# Patient Record
Sex: Male | Born: 1965 | Race: Black or African American | Hispanic: No | Marital: Single | Smoking: Former smoker
Health system: Southern US, Community
[De-identification: ages and names within clinical notes are randomized; demographics above are authoritative.]

## PROBLEM LIST (undated history)

## (undated) DIAGNOSIS — I5022 Chronic systolic (congestive) heart failure: Secondary | ICD-10-CM

## (undated) DIAGNOSIS — I340 Nonrheumatic mitral (valve) insufficiency: Secondary | ICD-10-CM

## (undated) DIAGNOSIS — D6859 Other primary thrombophilia: Secondary | ICD-10-CM

## (undated) DIAGNOSIS — I2699 Other pulmonary embolism without acute cor pulmonale: Secondary | ICD-10-CM

## (undated) DIAGNOSIS — I82409 Acute embolism and thrombosis of unspecified deep veins of unspecified lower extremity: Secondary | ICD-10-CM

## (undated) DIAGNOSIS — E785 Hyperlipidemia, unspecified: Secondary | ICD-10-CM

## (undated) DIAGNOSIS — I428 Other cardiomyopathies: Secondary | ICD-10-CM

## (undated) DIAGNOSIS — I10 Essential (primary) hypertension: Secondary | ICD-10-CM

## (undated) DIAGNOSIS — I639 Cerebral infarction, unspecified: Secondary | ICD-10-CM

## (undated) DIAGNOSIS — I351 Nonrheumatic aortic (valve) insufficiency: Secondary | ICD-10-CM

## (undated) DIAGNOSIS — I2510 Atherosclerotic heart disease of native coronary artery without angina pectoris: Secondary | ICD-10-CM

## (undated) DIAGNOSIS — E119 Type 2 diabetes mellitus without complications: Secondary | ICD-10-CM

---

## 2015-12-26 ENCOUNTER — Emergency Department (HOSPITAL_COMMUNITY): Payer: Medicaid Other

## 2015-12-26 ENCOUNTER — Encounter (HOSPITAL_COMMUNITY): Payer: Self-pay | Admitting: Emergency Medicine

## 2015-12-26 ENCOUNTER — Inpatient Hospital Stay (HOSPITAL_COMMUNITY)
Admission: EM | Admit: 2015-12-26 | Discharge: 2015-12-30 | DRG: 064 | Disposition: A | Discharge status: Home / Self Care | Payer: Medicaid Other | Attending: Internal Medicine | Admitting: Internal Medicine

## 2015-12-26 DIAGNOSIS — Z7982 Long term (current) use of aspirin: Secondary | ICD-10-CM

## 2015-12-26 DIAGNOSIS — E1159 Type 2 diabetes mellitus with other circulatory complications: Secondary | ICD-10-CM | POA: Diagnosis present

## 2015-12-26 DIAGNOSIS — I428 Other cardiomyopathies: Secondary | ICD-10-CM

## 2015-12-26 DIAGNOSIS — I63511 Cerebral infarction due to unspecified occlusion or stenosis of right middle cerebral artery: Secondary | ICD-10-CM | POA: Diagnosis present

## 2015-12-26 DIAGNOSIS — I2510 Atherosclerotic heart disease of native coronary artery without angina pectoris: Secondary | ICD-10-CM

## 2015-12-26 DIAGNOSIS — Z9114 Patient's other noncompliance with medication regimen: Secondary | ICD-10-CM

## 2015-12-26 DIAGNOSIS — D6859 Other primary thrombophilia: Secondary | ICD-10-CM | POA: Diagnosis present

## 2015-12-26 DIAGNOSIS — Z86711 Personal history of pulmonary embolism: Secondary | ICD-10-CM | POA: Diagnosis present

## 2015-12-26 DIAGNOSIS — I10 Essential (primary) hypertension: Secondary | ICD-10-CM | POA: Diagnosis present

## 2015-12-26 DIAGNOSIS — R29810 Facial weakness: Secondary | ICD-10-CM | POA: Diagnosis present

## 2015-12-26 DIAGNOSIS — I639 Cerebral infarction, unspecified: Secondary | ICD-10-CM | POA: Diagnosis present

## 2015-12-26 DIAGNOSIS — R4701 Aphasia: Secondary | ICD-10-CM | POA: Diagnosis present

## 2015-12-26 DIAGNOSIS — I519 Heart disease, unspecified: Secondary | ICD-10-CM | POA: Diagnosis present

## 2015-12-26 DIAGNOSIS — I5023 Acute on chronic systolic (congestive) heart failure: Secondary | ICD-10-CM

## 2015-12-26 DIAGNOSIS — I110 Hypertensive heart disease with heart failure: Secondary | ICD-10-CM | POA: Diagnosis present

## 2015-12-26 DIAGNOSIS — R0902 Hypoxemia: Secondary | ICD-10-CM

## 2015-12-26 DIAGNOSIS — I63411 Cerebral infarction due to embolism of right middle cerebral artery: Principal | ICD-10-CM | POA: Diagnosis present

## 2015-12-26 DIAGNOSIS — Z7901 Long term (current) use of anticoagulants: Secondary | ICD-10-CM

## 2015-12-26 DIAGNOSIS — I429 Cardiomyopathy, unspecified: Secondary | ICD-10-CM | POA: Diagnosis present

## 2015-12-26 DIAGNOSIS — E1169 Type 2 diabetes mellitus with other specified complication: Secondary | ICD-10-CM | POA: Diagnosis present

## 2015-12-26 DIAGNOSIS — Z86718 Personal history of other venous thrombosis and embolism: Secondary | ICD-10-CM

## 2015-12-26 DIAGNOSIS — E785 Hyperlipidemia, unspecified: Secondary | ICD-10-CM

## 2015-12-26 DIAGNOSIS — Z87891 Personal history of nicotine dependence: Secondary | ICD-10-CM

## 2015-12-26 HISTORY — DX: Essential (primary) hypertension: I10

## 2015-12-26 HISTORY — DX: Type 2 diabetes mellitus without complications: E11.9

## 2015-12-26 HISTORY — DX: Other pulmonary embolism without acute cor pulmonale: I26.99

## 2015-12-26 HISTORY — DX: Acute embolism and thrombosis of unspecified deep veins of unspecified lower extremity: I82.409

## 2015-12-26 HISTORY — DX: Atherosclerotic heart disease of native coronary artery without angina pectoris: I25.10

## 2015-12-26 HISTORY — DX: Other cardiomyopathies: I42.8

## 2015-12-26 HISTORY — DX: Nonrheumatic aortic (valve) insufficiency: I35.1

## 2015-12-26 HISTORY — DX: Hyperlipidemia, unspecified: E78.5

## 2015-12-26 HISTORY — DX: Nonrheumatic mitral (valve) insufficiency: I34.0

## 2015-12-26 HISTORY — DX: Chronic systolic (congestive) heart failure: I50.22

## 2015-12-26 HISTORY — DX: Other primary thrombophilia: D68.59

## 2015-12-26 HISTORY — DX: Cerebral infarction, unspecified: I63.9

## 2015-12-26 LAB — COMPREHENSIVE METABOLIC PANEL
ALT: 45 U/L (ref 17–63)
AST: 22 U/L (ref 15–41)
Albumin: 3.7 g/dL (ref 3.5–5.0)
Alkaline Phosphatase: 109 U/L (ref 38–126)
Anion gap: 11 (ref 5–15)
BUN: 14 mg/dL (ref 6–20)
CO2: 24 mmol/L (ref 22–32)
Calcium: 9.2 mg/dL (ref 8.9–10.3)
Chloride: 108 mmol/L (ref 101–111)
Creatinine, Ser: 1.32 mg/dL — ABNORMAL HIGH (ref 0.61–1.24)
GFR calc Af Amer: >60 mL/min (ref >60)
GFR calc non Af Amer: >60 mL/min (ref >60)
Glucose, Bld: 150 mg/dL — ABNORMAL HIGH (ref 65–99)
Potassium: 4 mmol/L (ref 3.5–5.1)
Sodium: 143 mmol/L (ref 135–145)
Total Bilirubin: 1.1 mg/dL (ref 0.3–1.2)
Total Protein: 6.7 g/dL (ref 6.5–8.1)

## 2015-12-26 LAB — PROTIME-INR
INR: 1.21 (ref 0.00–1.49)
Prothrombin Time: 15.5 seconds — ABNORMAL HIGH (ref 11.6–15.2)

## 2015-12-26 LAB — CBC
HCT: 43 % (ref 39.0–52.0)
Hemoglobin: 13.9 g/dL (ref 13.0–17.0)
MCH: 31.8 pg (ref 26.0–34.0)
MCHC: 32.3 g/dL (ref 30.0–36.0)
MCV: 98.4 fL (ref 78.0–100.0)
Platelets: 321 10*3/uL (ref 150–400)
RBC: 4.37 MIL/uL (ref 4.22–5.81)
RDW: 13.5 % (ref 11.5–15.5)
WBC: 5.5 10*3/uL (ref 4.0–10.5)

## 2015-12-26 LAB — DIFFERENTIAL
Basophils Absolute: 0 10*3/uL (ref 0.0–0.1)
Basophils Relative: 0 %
Eosinophils Absolute: 0.1 10*3/uL (ref 0.0–0.7)
Eosinophils Relative: 1 %
Lymphocytes Relative: 38 %
Lymphs Abs: 2.1 10*3/uL (ref 0.7–4.0)
Monocytes Absolute: 0.2 10*3/uL (ref 0.1–1.0)
Monocytes Relative: 4 %
Neutro Abs: 3.1 10*3/uL (ref 1.7–7.7)
Neutrophils Relative %: 57 %

## 2015-12-26 LAB — I-STAT TROPONIN, ED: Troponin i, poc: 0.07 ng/mL (ref 0.00–0.08)

## 2015-12-26 LAB — APTT: aPTT: 29 seconds (ref 24–37)

## 2015-12-26 MED ORDER — ASPIRIN 81 MG PO CHEW
324.0000 mg | CHEWABLE_TABLET | Freq: Once | ORAL | Status: AC
  Administered 2015-12-27: 324 mg via ORAL
  Filled 2015-12-26: qty 4

## 2015-12-26 MED ORDER — OXYCODONE-ACETAMINOPHEN 5-325 MG PO TABS
2.0000 | ORAL_TABLET | Freq: Once | ORAL | Status: AC
  Administered 2015-12-26: 2 via ORAL
  Filled 2015-12-26: qty 2

## 2015-12-26 NOTE — ED Provider Notes (Signed)
CSN: [id number]     Arrival date & time 12/26/15  1525 History   First MD Initiated Contact with Patient 12/26/15 1844     Chief Complaint  Patient presents with  . Shortness of Breath  . Facial Droop  . Aphasia     (Consider location/radiation/quality/duration/timing/severity/associated sxs/prior Treatment) Patient is a 50 y.o. male presenting with Acute Neurological Problem. The history is provided by the patient. No language interpreter was used.  Cerebrovascular Accident This is a new problem. The current episode started today. The problem occurs constantly. The problem has been unchanged. Associated symptoms include weakness. Pertinent negatives include no fever or sore throat. Nothing aggravates the symptoms. He has tried nothing for the symptoms. The treatment provided no relief.  Pt reports he got up this am to take his dog out at around 8:30.  Pt noticed while walking the dog that he could not control drool coming out of the left side of his face.  Pt has had a PE in the past and is suppose to be taking coumadin but ran out.  (several days without)   Past Medical History  Diagnosis Date  . Pulmonary embolism (HCC)   . Hypertension   . Diabetes mellitus without complication (HCC)    History reviewed. No pertinent past surgical history. No family history on file. Social History  Substance Use Topics  . Smoking status: Current Some Day Smoker  . Smokeless tobacco: None  . Alcohol Use: No    Review of Systems  Constitutional: Negative for fever.  HENT: Negative for sore throat.   Neurological: Positive for weakness.  All other systems reviewed and are negative.     Allergies  Review of patient's allergies indicates no known allergies.  Home Medications   Prior to Admission medications   Medication Sig Start Date End Date Taking? Authorizing Provider  aspirin 325 MG tablet Take 325 mg by mouth daily.   Yes Historical Provider, MD  atorvastatin (LIPITOR) 40 MG  tablet Take 40 mg by mouth daily. 09/18/15  Yes Historical Provider, MD  furosemide (LASIX) 20 MG tablet Take 20 mg by mouth 2 (two) times daily.   Yes Historical Provider, MD  glipiZIDE (GLUCOTROL) 10 MG tablet Take 10 mg by mouth 2 (two) times daily. 09/18/15  Yes Historical Provider, MD  hydrochlorothiazide (HYDRODIURIL) 25 MG tablet Take 25 mg by mouth daily. 09/18/15 09/17/16 Yes Historical Provider, MD  lisinopril (PRINIVIL,ZESTRIL) 40 MG tablet Take 40 mg by mouth daily.   Yes Historical Provider, MD  metFORMIN (GLUCOPHAGE) 1000 MG tablet Take 1,000 mg by mouth 2 (two) times daily. 09/18/15  Yes Historical Provider, MD  metoprolol succinate (TOPROL-XL) 25 MG 24 hr tablet Take 25 mg by mouth daily. 12/05/15  Yes Historical Provider, MD  warfarin (COUMADIN) 5 MG tablet Take 10-15 mg by mouth See admin instructions. 10 mg on all other days except on Wednesday patient to take 15 mg 12/20/15  Yes Historical Provider, MD   BP 116/86 mmHg  Pulse 96  Temp(Src) 98.1 F (36.7 C) (Oral)  Resp 18  SpO2 85% Physical Exam  Constitutional: He is oriented to person, place, and time. He appears well-developed and well-nourished.  HENT:  Head: Normocephalic and atraumatic.  Eyes: Conjunctivae and EOM are normal. Pupils are equal, round, and reactive to light.  Neck: Normal range of motion.  Cardiovascular: Normal rate and normal heart sounds.   Pulmonary/Chest: Effort normal and breath sounds normal.  Abdominal: Soft. He exhibits no distension.  Musculoskeletal: Normal  range of motion.  Neurological: He is alert and oriented to person, place, and time. He exhibits normal muscle tone. Coordination normal.  Left mouth droop,  No eye involvement,   Skin: Skin is warm.  Psychiatric: He has a normal mood and affect.  Nursing note and vitals reviewed.   ED Course  Procedures (including critical care time) Labs Review Labs Reviewed  PROTIME-INR - Abnormal; Notable for the following:    Prothrombin  Time 15.5 (*)    All other components within normal limits  COMPREHENSIVE METABOLIC PANEL - Abnormal; Notable for the following:    Glucose, Bld 150 (*)    Creatinine, Ser 1.32 (*)    All other components within normal limits  APTT  CBC  DIFFERENTIAL  Rosezena Sensor, ED    Imaging Review Dg Chest 2 View  12/26/2015  CLINICAL DATA:  Shortness of breath. Bilateral flank pain. Ex-smoker. EXAM: CHEST  2 VIEW COMPARISON:  None. FINDINGS: Enlarged cardiac silhouette. Mild prominence of the pulmonary vasculature and interstitial markings with more prominent interstitial markings in the lower lung zones. Minimal bilateral pleural fluid. Unremarkable bones. IMPRESSION: Cardiomegaly and mild changes of congestive heart failure. Electronically Signed   By: Beckie Salts M.D.   On: 12/26/2015 16:01   Ct Head Wo Contrast  12/26/2015  CLINICAL DATA:  Left arm weakness EXAM: CT HEAD WITHOUT CONTRAST TECHNIQUE: Contiguous axial images were obtained from the base of the skull through the vertex without intravenous contrast. COMPARISON:  None. FINDINGS: The bony calvarium is intact. The ventricles are of normal size and configuration. No findings to suggest acute hemorrhage, acute infarction or space-occupying mass lesion are noted. IMPRESSION: No acute abnormality noted. Electronically Signed   By: Alcide Clever M.D.   On: 12/26/2015 16:16   Mr Maxine Glenn Head Wo Contrast  12/26/2015  CLINICAL DATA:  Initial evaluation for left-sided facial droop. EXAM: MRI HEAD WITHOUT CONTRAST MRA HEAD WITHOUT CONTRAST TECHNIQUE: Multiplanar, multiecho pulse sequences of the brain and surrounding structures were obtained without intravenous contrast. Angiographic images of the head were obtained using MRA technique without contrast. COMPARISON:  Prior CT from earlier the same day. FINDINGS: MRI HEAD FINDINGS Patchy restricted diffusion involving the right frontal lobe, primarily the operculum, with involvement of the superior right  insular cortex. Patchy shifted diffusion extend superiorly and medially into the deep white matter of the right corona radiata S/centrum semi ovale. No involvement of the deep gray nuclei. No associated hemorrhage or mass effect. No other areas of infarction. Major intracranial vascular flow voids are maintained. Cerebral volume within normal limits for patient age. Minimal chronic small vessel ischemic type changes within the periventricular and deep white matter. No chronic infarction. No mass lesion, midline shift or mass effect. No hydrocephalus. No extra-axial fluid collection. No acute or chronic intracranial hemorrhage. Craniocervical junction within normal limits. Pituitary gland normal. No acute abnormality about the orbits. Paranasal sinuses are clear. No mastoid effusion. Inner ear structures normal. Bone marrow signal intensity within normal limits. No scalp soft tissue abnormality. MRA HEAD FINDINGS ANTERIOR CIRCULATION: Distal cervical segments of the internal carotid arteries are widely patent with antegrade flow. Petrous, cavernous, and supraclinoid segments widely patent. A1 segments, anterior communicating artery common anterior cerebral arteries patent without stenosis. M1 segments well opacified without stenosis or thrombosis. MCA bifurcations normal. No proximal M2 branch occlusion. Distal MCA branches symmetric and well opacified. POSTERIOR CIRCULATION: Vertebral arteries patent to the vertebrobasilar junction. Basilar artery widely patent. Superior cerebellar and posterior cerebral arteries widely  patent and well opacified to their distal aspects. Small posterior communicating arteries and about laterally. No aneurysm. IMPRESSION: MRI HEAD IMPRESSION: 1. Patchy acute ischemic multi focal right MCA territory infarcts as above. No significant mass effect or associated hemorrhage. 2. Minimal chronic small vessel ischemic disease. MRA HEAD IMPRESSION: Normal intracranial MRA. No proximal or large  arterial branch occlusion. No hemodynamically significant or correctable stenosis Electronically Signed   By: Rise Mu M.D.   On: 12/26/2015 23:27   Mr Brain Wo Contrast  12/26/2015  CLINICAL DATA:  Initial evaluation for left-sided facial droop. EXAM: MRI HEAD WITHOUT CONTRAST MRA HEAD WITHOUT CONTRAST TECHNIQUE: Multiplanar, multiecho pulse sequences of the brain and surrounding structures were obtained without intravenous contrast. Angiographic images of the head were obtained using MRA technique without contrast. COMPARISON:  Prior CT from earlier the same day. FINDINGS: MRI HEAD FINDINGS Patchy restricted diffusion involving the right frontal lobe, primarily the operculum, with involvement of the superior right insular cortex. Patchy shifted diffusion extend superiorly and medially into the deep white matter of the right corona radiata S/centrum semi ovale. No involvement of the deep gray nuclei. No associated hemorrhage or mass effect. No other areas of infarction. Major intracranial vascular flow voids are maintained. Cerebral volume within normal limits for patient age. Minimal chronic small vessel ischemic type changes within the periventricular and deep white matter. No chronic infarction. No mass lesion, midline shift or mass effect. No hydrocephalus. No extra-axial fluid collection. No acute or chronic intracranial hemorrhage. Craniocervical junction within normal limits. Pituitary gland normal. No acute abnormality about the orbits. Paranasal sinuses are clear. No mastoid effusion. Inner ear structures normal. Bone marrow signal intensity within normal limits. No scalp soft tissue abnormality. MRA HEAD FINDINGS ANTERIOR CIRCULATION: Distal cervical segments of the internal carotid arteries are widely patent with antegrade flow. Petrous, cavernous, and supraclinoid segments widely patent. A1 segments, anterior communicating artery common anterior cerebral arteries patent without stenosis.  M1 segments well opacified without stenosis or thrombosis. MCA bifurcations normal. No proximal M2 branch occlusion. Distal MCA branches symmetric and well opacified. POSTERIOR CIRCULATION: Vertebral arteries patent to the vertebrobasilar junction. Basilar artery widely patent. Superior cerebellar and posterior cerebral arteries widely patent and well opacified to their distal aspects. Small posterior communicating arteries and about laterally. No aneurysm. IMPRESSION: MRI HEAD IMPRESSION: 1. Patchy acute ischemic multi focal right MCA territory infarcts as above. No significant mass effect or associated hemorrhage. 2. Minimal chronic small vessel ischemic disease. MRA HEAD IMPRESSION: Normal intracranial MRA. No proximal or large arterial branch occlusion. No hemodynamically significant or correctable stenosis Electronically Signed   By: Rise Mu M.D.   On: 12/26/2015 23:27   I have personally reviewed and evaluated these images and lab results as part of my medical decision-making.   EKG Interpretation   Date/Time:  Wednesday December 26 2015 15:39:57 EST Ventricular Rate:  112 PR Interval:  150 QRS Duration: 88 QT Interval:  364 QTC Calculation: 496 R Axis:   87 Text Interpretation:  Sinus tachycardia Nonspecific T wave abnormality No  old tracing to compare Confirmed by Juleen China  MD, STEPHEN [phone]) on 12/26/2015  8:19:48 PM      MDM I spoke to Dr, Hosie Poisson who advised MRi.   He will see and consult.  He request hospitalist admit.  Consult to hospitalist who will admit.   I spoke to critical care who advised no anticoagulation unless neurology feels it is needed for cva   Final diagnoses:  Stroke [facility])  Elson Areas, PA-C 12/26/15 2355  Lonia Skinner Kamaili, PA-C 12/27/15 1423  Raeford Razor, MD 01/02/16 [phone]

## 2015-12-26 NOTE — Consult Note (Addendum)
Stroke Consult Consulting Physician: Dr Juleen China  Chief Complaint: facial droop  HPI: Ian Foster is an 50 y.o. male hx of HTN, DM, PE (on coumadin) presenting with sudden onset of left-sided facial droop. Reports waking up in the morning and noted that he could not control drool coming out of the left side of his mouth. MRI brain imaging reviewed, shows right MCA distribution infarct.   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 He ran out a few days ago and has not been taking it. INR 1.21 in the ED. 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 Pulmonary embolism (HCC)   . Hypertension   . Diabetes mellitus without complication (HCC)     History reviewed. No pertinent past surgical history.  No family history on file. Social History:  reports that he has been smoking.  He does not have any smokeless tobacco history on file. He reports that he does not drink alcohol or use illicit drugs.  Allergies: No Known Allergies   (Not in a hospital admission)  ROS: Out of a complete 14 system review, the patient complains of only the following symptoms, and all other reviewed systems are negative. +facial droop   Physical Examination: Filed Vitals:   12/26/15 2000 12/26/15 2245  BP:  116/86  Pulse: 98 96  Temp:    Resp: 29 18   Physical Exam  Constitutional: He appears well-developed and well-nourished.  Psych: Affect appropriate to situation Eyes: No scleral injection HENT: No OP obstrucion Head: Normocephalic.  Cardiovascular: Normal rate and regular rhythm.  Respiratory: Effort normal and breath sounds normal.  GI: Soft. Bowel sounds are normal. No distension.  There is no tenderness.  Skin: WDI   Neurologic Examination: Mental Status: Alert, oriented, thought content appropriate.  Speech fluent without evidence of aphasia. Moderate dysarthria. Able to follow 3 step commands without difficulty. Cranial Nerves: II: funduscopic exam wnl bilaterally, visual fields grossly normal, pupils equal, round, reactive to light and accommodation III,IV, VI: ptosis not present, extra-ocular motions intact bilaterally V,VII: left facial droop, facial light touch sensation normal bilaterally VIII: hearing normal bilaterally IX,X: gag reflex present XI: trapezius strength/neck flexion strength normal bilaterally XII: tongue strength normal  Motor: Right : Upper extremity    Left:     Upper extremity 5/5 deltoid       5/5 deltoid 5/5 biceps      5/5 biceps  5/5 triceps      5/5 triceps 5/5 hand grip      5/5 hand grip  Lower extremity     Lower extremity 5/5 hip flexor      5/5 hip flexor 5/5 quadricep      5/5 quadriceps  5/5 hamstrings     5/5 hamstrings 5/5 plantar flexion       5/5 plantar flexion 5/5 plantar extension     5/5 plantar extension Tone and bulk:normal tone throughout; no atrophy noted Sensory: Pinprick and light touch intact throughout, bilaterally Deep Tendon Reflexes: 2+ and symmetric throughout Plantars: Right: downgoing   Left: downgoing Cerebellar: normal finger-to-nose, and normal heel-to-shin test Gait: deferred  Laboratory Studies:   Basic Metabolic Panel:  Recent  Labs Lab 12/26/15 1545  NA 143  K 4.0  CL 108  CO2 24  GLUCOSE 150*  BUN 14  CREATININE 1.32*  CALCIUM 9.2    Liver Function Tests:  Recent Labs Lab 12/26/15 1545  AST 22  ALT 45  ALKPHOS 109  BILITOT 1.1  PROT 6.7  ALBUMIN 3.7   No results for input(s): LIPASE, AMYLASE in the last 168 hours. No results for input(s): AMMONIA in the last 168 hours.  CBC:  Recent Labs Lab 12/26/15 1545  WBC 5.5  NEUTROABS 3.1  HGB 13.9  HCT 43.0   MCV 98.4  PLT 321    Cardiac Enzymes: No results for input(s): CKTOTAL, CKMB, CKMBINDEX, TROPONINI in the last 168 hours.  BNP: Invalid input(s): POCBNP  CBG: No results for input(s): GLUCAP in the last 168 hours.  Microbiology: No results found for this or any previous visit.  Coagulation Studies:  Recent Labs  12/26/15 1545  LABPROT 15.5*  INR 1.21    Urinalysis: No results for input(s): COLORURINE, LABSPEC, PHURINE, GLUCOSEU, HGBUR, BILIRUBINUR, KETONESUR, PROTEINUR, UROBILINOGEN, NITRITE, LEUKOCYTESUR in the last 168 hours.  Invalid input(s): APPERANCEUR  Lipid Panel:  No results found for: CHOL, TRIG, HDL, CHOLHDL, VLDL, LDLCALC  HgbA1C: No results found for: HGBA1C  Urine Drug Screen:  No results found for: LABOPIA, COCAINSCRNUR, LABBENZ, AMPHETMU, THCU, LABBARB  Alcohol Level: No results for input(s): ETH in the last 168 hours.  Other results:  Imaging: Dg Chest 2 View  12/26/2015  CLINICAL DATA:  Shortness of breath. Bilateral flank pain. Ex-smoker. EXAM: CHEST  2 VIEW COMPARISON:  None. FINDINGS: Enlarged cardiac silhouette. Mild prominence of the pulmonary vasculature and interstitial markings with more prominent interstitial markings in the lower lung zones. Minimal bilateral pleural fluid. Unremarkable bones. IMPRESSION: Cardiomegaly and mild changes of congestive heart failure. Electronically Signed   By: Beckie Salts M.D.   On: 12/26/2015 16:01   Ct Head Wo Contrast  12/26/2015  CLINICAL DATA:  Left arm weakness EXAM: CT HEAD WITHOUT CONTRAST TECHNIQUE: Contiguous axial images were obtained from the base of the skull through the vertex without intravenous contrast. COMPARISON:  None. FINDINGS: The bony calvarium is intact. The ventricles are of normal size and configuration. No findings to suggest acute hemorrhage, acute infarction or space-occupying mass lesion are noted. IMPRESSION: No acute abnormality noted. Electronically Signed   By: Alcide Clever M.D.    On: 12/26/2015 16:16   Mr Maxine Glenn Head Wo Contrast  12/26/2015  CLINICAL DATA:  Initial evaluation for left-sided facial droop. EXAM: MRI HEAD WITHOUT CONTRAST MRA HEAD WITHOUT CONTRAST TECHNIQUE: Multiplanar, multiecho pulse sequences of the brain and surrounding structures were obtained without intravenous contrast. Angiographic images of the head were obtained using MRA technique without contrast. COMPARISON:  Prior CT from earlier the same day. FINDINGS: MRI HEAD FINDINGS Patchy restricted diffusion involving the right frontal lobe, primarily the operculum, with involvement of the superior right insular cortex. Patchy shifted diffusion extend superiorly and medially into the deep white matter of the right corona radiata S/centrum semi ovale. No involvement of the deep gray nuclei. No associated hemorrhage or mass effect. No other areas of infarction. Major intracranial vascular flow voids are maintained. Cerebral volume within normal limits for patient age. Minimal chronic small vessel ischemic type changes within the periventricular and deep white matter. No chronic infarction. No mass lesion, midline shift or mass effect. No hydrocephalus. No extra-axial fluid collection. No acute or chronic intracranial hemorrhage. Craniocervical junction within normal limits.  Pituitary gland normal. No acute abnormality about the orbits. Paranasal sinuses are clear. No mastoid effusion. Inner ear structures normal. Bone marrow signal intensity within normal limits. No scalp soft tissue abnormality. MRA HEAD FINDINGS ANTERIOR CIRCULATION: Distal cervical segments of the internal carotid arteries are widely patent with antegrade flow. Petrous, cavernous, and supraclinoid segments widely patent. A1 segments, anterior communicating artery common anterior cerebral arteries patent without stenosis. M1 segments well opacified without stenosis or thrombosis. MCA bifurcations normal. No proximal M2 branch occlusion. Distal MCA  branches symmetric and well opacified. POSTERIOR CIRCULATION: Vertebral arteries patent to the vertebrobasilar junction. Basilar artery widely patent. Superior cerebellar and posterior cerebral arteries widely patent and well opacified to their distal aspects. Small posterior communicating arteries and about laterally. No aneurysm. IMPRESSION: MRI HEAD IMPRESSION: 1. Patchy acute ischemic multi focal right MCA territory infarcts as above. No significant mass effect or associated hemorrhage. 2. Minimal chronic small vessel ischemic disease. MRA HEAD IMPRESSION: Normal intracranial MRA. No proximal or large arterial branch occlusion. No hemodynamically significant or correctable stenosis Electronically Signed   By: Rise Mu M.D.   On: 12/26/2015 23:27   Mr Brain Wo Contrast  12/26/2015  CLINICAL DATA:  Initial evaluation for left-sided facial droop. EXAM: MRI HEAD WITHOUT CONTRAST MRA HEAD WITHOUT CONTRAST TECHNIQUE: Multiplanar, multiecho pulse sequences of the brain and surrounding structures were obtained without intravenous contrast. Angiographic images of the head were obtained using MRA technique without contrast. COMPARISON:  Prior CT from earlier the same day. FINDINGS: MRI HEAD FINDINGS Patchy restricted diffusion involving the right frontal lobe, primarily the operculum, with involvement of the superior right insular cortex. Patchy shifted diffusion extend superiorly and medially into the deep white matter of the right corona radiata S/centrum semi ovale. No involvement of the deep gray nuclei. No associated hemorrhage or mass effect. No other areas of infarction. Major intracranial vascular flow voids are maintained. Cerebral volume within normal limits for patient age. Minimal chronic small vessel ischemic type changes within the periventricular and deep white matter. No chronic infarction. No mass lesion, midline shift or mass effect. No hydrocephalus. No extra-axial fluid collection. No  acute or chronic intracranial hemorrhage. Craniocervical junction within normal limits. Pituitary gland normal. No acute abnormality about the orbits. Paranasal sinuses are clear. No mastoid effusion. Inner ear structures normal. Bone marrow signal intensity within normal limits. No scalp soft tissue abnormality. MRA HEAD FINDINGS ANTERIOR CIRCULATION: Distal cervical segments of the internal carotid arteries are widely patent with antegrade flow. Petrous, cavernous, and supraclinoid segments widely patent. A1 segments, anterior communicating artery common anterior cerebral arteries patent without stenosis. M1 segments well opacified without stenosis or thrombosis. MCA bifurcations normal. No proximal M2 branch occlusion. Distal MCA branches symmetric and well opacified. POSTERIOR CIRCULATION: Vertebral arteries patent to the vertebrobasilar junction. Basilar artery widely patent. Superior cerebellar and posterior cerebral arteries widely patent and well opacified to their distal aspects. Small posterior communicating arteries and about laterally. No aneurysm. IMPRESSION: MRI HEAD IMPRESSION: 1. Patchy acute ischemic multi focal right MCA territory infarcts as above. No significant mass effect or associated hemorrhage. 2. Minimal chronic small vessel ischemic disease. MRA HEAD IMPRESSION: Normal intracranial MRA. No proximal or large arterial branch occlusion. No hemodynamically significant or correctable stenosis Electronically Signed   By: Rise Mu M.D.   On: 12/26/2015 23:27    Assessment: 50 y.o. male hx of PE and DVT x 2, protein C and S deficiency presenting after waking up with left-sided facial droop. MRI brain shows  acute right MCA infarct.   Of note, patient was diagnosed with a large PE, including saddle embolus, at [facility] in 10/2015. Was started on coumadin but he has not been taking it recently after he ran out. INR on admission was 1.2. Discussed PE with CCM, Dr Belia Heman. Based on  overall stability with no signs of respiratory distress he would not recommend urgent anticoagulation at this time.   Plan: 1. HgbA1c, fasting lipid panel 2. MRI, MRA  of the brain without contrast completed 3. PT consult, OT consult, Speech consult 4. Echocardiogram 5. Carotid dopplers 6. Prophylactic therapy-ASA 325mg  daily. Will likely need to restart anticoagulation in the future. 7. Risk factor modification 8. Telemetry monitoring 9. Frequent neuro checks 10. NPO until RN stroke swallow screen     Elspeth Cho, DO [facility] [phone]  If 7pm- 7am, please page neurology on call as listed in AMION. 12/26/2015, 11:58 PM

## 2015-12-26 NOTE — ED Notes (Signed)
Pt off unit for MRI

## 2015-12-26 NOTE — ED Notes (Signed)
C/o sob x 2 days.  Diagnosed with PE in November.  C/o L sided facial droop and slurred speech since waking up this morning.  Denies weakness to extremities.  Also c/o pain to L lateral side since this morning.

## 2015-12-27 ENCOUNTER — Inpatient Hospital Stay (HOSPITAL_COMMUNITY): Payer: Medicaid Other

## 2015-12-27 ENCOUNTER — Encounter (HOSPITAL_COMMUNITY): Payer: Self-pay | Admitting: Internal Medicine

## 2015-12-27 DIAGNOSIS — Z9114 Patient's other noncompliance with medication regimen: Secondary | ICD-10-CM | POA: Diagnosis not present

## 2015-12-27 DIAGNOSIS — I519 Heart disease, unspecified: Secondary | ICD-10-CM | POA: Diagnosis present

## 2015-12-27 DIAGNOSIS — I639 Cerebral infarction, unspecified: Secondary | ICD-10-CM

## 2015-12-27 DIAGNOSIS — E1169 Type 2 diabetes mellitus with other specified complication: Secondary | ICD-10-CM | POA: Diagnosis present

## 2015-12-27 DIAGNOSIS — Z86718 Personal history of other venous thrombosis and embolism: Secondary | ICD-10-CM | POA: Diagnosis not present

## 2015-12-27 DIAGNOSIS — Z86711 Personal history of pulmonary embolism: Secondary | ICD-10-CM | POA: Diagnosis not present

## 2015-12-27 DIAGNOSIS — Z7901 Long term (current) use of anticoagulants: Secondary | ICD-10-CM | POA: Diagnosis not present

## 2015-12-27 DIAGNOSIS — I5023 Acute on chronic systolic (congestive) heart failure: Secondary | ICD-10-CM | POA: Diagnosis present

## 2015-12-27 DIAGNOSIS — Z7982 Long term (current) use of aspirin: Secondary | ICD-10-CM | POA: Diagnosis not present

## 2015-12-27 DIAGNOSIS — I429 Cardiomyopathy, unspecified: Secondary | ICD-10-CM | POA: Diagnosis present

## 2015-12-27 DIAGNOSIS — E785 Hyperlipidemia, unspecified: Secondary | ICD-10-CM | POA: Diagnosis present

## 2015-12-27 DIAGNOSIS — I2510 Atherosclerotic heart disease of native coronary artery without angina pectoris: Secondary | ICD-10-CM | POA: Diagnosis present

## 2015-12-27 DIAGNOSIS — I63511 Cerebral infarction due to unspecified occlusion or stenosis of right middle cerebral artery: Secondary | ICD-10-CM | POA: Diagnosis present

## 2015-12-27 DIAGNOSIS — E1159 Type 2 diabetes mellitus with other circulatory complications: Secondary | ICD-10-CM | POA: Diagnosis present

## 2015-12-27 DIAGNOSIS — R4701 Aphasia: Secondary | ICD-10-CM | POA: Diagnosis present

## 2015-12-27 DIAGNOSIS — I110 Hypertensive heart disease with heart failure: Secondary | ICD-10-CM | POA: Diagnosis present

## 2015-12-27 DIAGNOSIS — D6859 Other primary thrombophilia: Secondary | ICD-10-CM | POA: Diagnosis present

## 2015-12-27 DIAGNOSIS — I10 Essential (primary) hypertension: Secondary | ICD-10-CM | POA: Diagnosis present

## 2015-12-27 DIAGNOSIS — I63411 Cerebral infarction due to embolism of right middle cerebral artery: Secondary | ICD-10-CM | POA: Diagnosis not present

## 2015-12-27 DIAGNOSIS — R0602 Shortness of breath: Secondary | ICD-10-CM | POA: Diagnosis not present

## 2015-12-27 DIAGNOSIS — R29810 Facial weakness: Secondary | ICD-10-CM | POA: Diagnosis present

## 2015-12-27 DIAGNOSIS — R0902 Hypoxemia: Secondary | ICD-10-CM | POA: Diagnosis present

## 2015-12-27 DIAGNOSIS — Z87891 Personal history of nicotine dependence: Secondary | ICD-10-CM | POA: Diagnosis not present

## 2015-12-27 LAB — CBC
HEMATOCRIT: 39.2 % (ref 39.0–52.0)
HEMOGLOBIN: 12.5 g/dL — AB (ref 13.0–17.0)
MCH: 31.3 pg (ref 26.0–34.0)
MCHC: 31.9 g/dL (ref 30.0–36.0)
MCV: 98 fL (ref 78.0–100.0)
Platelets: 296 10*3/uL (ref 150–400)
RBC: 4 MIL/uL — AB (ref 4.22–5.81)
RDW: 13.5 % (ref 11.5–15.5)
WBC: 6.4 10*3/uL (ref 4.0–10.5)

## 2015-12-27 LAB — COMPREHENSIVE METABOLIC PANEL
ALBUMIN: 2.9 g/dL — AB (ref 3.5–5.0)
ALK PHOS: 92 U/L (ref 38–126)
ALT: 38 U/L (ref 17–63)
ANION GAP: 9 (ref 5–15)
AST: 18 U/L (ref 15–41)
BILIRUBIN TOTAL: 0.8 mg/dL (ref 0.3–1.2)
BUN: 15 mg/dL (ref 6–20)
CALCIUM: 8.7 mg/dL — AB (ref 8.9–10.3)
CO2: 25 mmol/L (ref 22–32)
CREATININE: 1.19 mg/dL (ref 0.61–1.24)
Chloride: 110 mmol/L (ref 101–111)
GFR calc Af Amer: >60 mL/min (ref >60)
GFR calc non Af Amer: >60 mL/min (ref >60)
GLUCOSE: 106 mg/dL — AB (ref 65–99)
Potassium: 3.7 mmol/L (ref 3.5–5.1)
SODIUM: 144 mmol/L (ref 135–145)
TOTAL PROTEIN: 5.6 g/dL — AB (ref 6.5–8.1)

## 2015-12-27 LAB — LIPID PANEL
CHOLESTEROL: 204 mg/dL — AB (ref 0–200)
HDL: 28 mg/dL — ABNORMAL LOW (ref >40)
LDL Cholesterol: 158 mg/dL — ABNORMAL HIGH (ref 0–99)
TRIGLYCERIDES: 92 mg/dL (ref <150)
Total CHOL/HDL Ratio: 7.3 RATIO
VLDL: 18 mg/dL (ref 0–40)

## 2015-12-27 LAB — GLUCOSE, CAPILLARY
Glucose-Capillary: 115 mg/dL — ABNORMAL HIGH (ref 65–99)
Glucose-Capillary: 167 mg/dL — ABNORMAL HIGH (ref 65–99)

## 2015-12-27 MED ORDER — FUROSEMIDE 20 MG PO TABS
20.0000 mg | ORAL_TABLET | Freq: Two times a day (BID) | ORAL | Status: DC
  Administered 2015-12-28: 20 mg via ORAL
  Filled 2015-12-27: qty 1

## 2015-12-27 MED ORDER — WARFARIN SODIUM 7.5 MG PO TABS
20.0000 mg | ORAL_TABLET | Freq: Once | ORAL | Status: AC
  Administered 2015-12-27: 20 mg via ORAL
  Filled 2015-12-27: qty 1

## 2015-12-27 MED ORDER — SENNOSIDES-DOCUSATE SODIUM 8.6-50 MG PO TABS: 1.0000 | ORAL_TABLET | Freq: Every evening | ORAL | Status: DC | PRN

## 2015-12-27 MED ORDER — ONDANSETRON HCL 4 MG/2ML IJ SOLN: 4.0000 mg | Freq: Three times a day (TID) | INTRAMUSCULAR | Status: AC | PRN

## 2015-12-27 MED ORDER — INSULIN ASPART 100 UNIT/ML ~~LOC~~ SOLN: 0.0000 [IU] | Freq: Every day | SUBCUTANEOUS | Status: DC

## 2015-12-27 MED ORDER — LISINOPRIL 40 MG PO TABS
40.0000 mg | ORAL_TABLET | Freq: Every day | ORAL | Status: DC
  Administered 2015-12-27 – 2015-12-30 (×4): 40 mg via ORAL
  Filled 2015-12-27 (×4): qty 1

## 2015-12-27 MED ORDER — WARFARIN - PHARMACIST DOSING INPATIENT
Freq: Every day | Status: DC
  Administered 2015-12-27: 18:00:00

## 2015-12-27 MED ORDER — ASPIRIN 325 MG PO TABS
325.0000 mg | ORAL_TABLET | Freq: Every day | ORAL | Status: DC
  Filled 2015-12-27: qty 1

## 2015-12-27 MED ORDER — METOPROLOL SUCCINATE ER 25 MG PO TB24
25.0000 mg | ORAL_TABLET | Freq: Every day | ORAL | Status: DC
  Administered 2015-12-27 – 2015-12-30 (×4): 25 mg via ORAL
  Filled 2015-12-27 (×4): qty 1

## 2015-12-27 MED ORDER — ASPIRIN 81 MG PO CHEW
324.0000 mg | CHEWABLE_TABLET | Freq: Every day | ORAL | Status: DC
  Administered 2015-12-28 – 2015-12-30 (×3): 324 mg via ORAL
  Filled 2015-12-27 (×3): qty 4

## 2015-12-27 MED ORDER — ASPIRIN 300 MG RE SUPP: 300.0000 mg | Freq: Every day | RECTAL | Status: DC

## 2015-12-27 MED ORDER — GLIPIZIDE 5 MG PO TABS
10.0000 mg | ORAL_TABLET | Freq: Two times a day (BID) | ORAL | Status: DC
  Administered 2015-12-27 – 2015-12-30 (×7): 10 mg via ORAL
  Filled 2015-12-27 (×7): qty 2

## 2015-12-27 MED ORDER — STROKE: EARLY STAGES OF RECOVERY BOOK
Freq: Once | Status: AC
  Administered 2015-12-27: 04:00:00
  Filled 2015-12-27: qty 1

## 2015-12-27 MED ORDER — ATORVASTATIN CALCIUM 40 MG PO TABS: 40.0000 mg | ORAL_TABLET | Freq: Every day | ORAL | Status: DC

## 2015-12-27 MED ORDER — ASPIRIN EC 81 MG PO TBEC
81.0000 mg | DELAYED_RELEASE_TABLET | Freq: Every day | ORAL | Status: DC
  Administered 2015-12-27: 81 mg via ORAL
  Filled 2015-12-27: qty 1

## 2015-12-27 MED ORDER — INSULIN ASPART 100 UNIT/ML ~~LOC~~ SOLN
0.0000 [IU] | Freq: Three times a day (TID) | SUBCUTANEOUS | Status: DC
  Administered 2015-12-27: 1 [IU] via SUBCUTANEOUS
  Administered 2015-12-28: 2 [IU] via SUBCUTANEOUS
  Administered 2015-12-29: 1 [IU] via SUBCUTANEOUS

## 2015-12-27 MED ORDER — ACETAMINOPHEN 500 MG PO TABS
500.0000 mg | ORAL_TABLET | Freq: Four times a day (QID) | ORAL | Status: DC | PRN
  Administered 2015-12-27: 500 mg via ORAL
  Filled 2015-12-27: qty 1

## 2015-12-27 MED ORDER — ATORVASTATIN CALCIUM 80 MG PO TABS
80.0000 mg | ORAL_TABLET | Freq: Every day | ORAL | Status: DC
  Administered 2015-12-27 – 2015-12-30 (×4): 80 mg via ORAL
  Filled 2015-12-27 (×4): qty 1

## 2015-12-27 MED ORDER — ENSURE ENLIVE PO LIQD
237.0000 mL | Freq: Two times a day (BID) | ORAL | Status: DC
  Administered 2015-12-28 – 2015-12-30 (×3): 237 mL via ORAL

## 2015-12-27 MED ORDER — ENOXAPARIN SODIUM 40 MG/0.4ML ~~LOC~~ SOLN
40.0000 mg | SUBCUTANEOUS | Status: DC
  Administered 2015-12-27 – 2015-12-30 (×4): 40 mg via SUBCUTANEOUS
  Filled 2015-12-27 (×4): qty 0.4

## 2015-12-27 NOTE — Progress Notes (Signed)
Patient Demographics:    Timmey Lamba, is a 50 y.o. male, DOB - 15-May-1966, ZOX:[id number]  Admit date - 12/26/2015   Admitting Physician Eduard Clos, MD  Outpatient Primary MD for the patient is No primary care provider on file.  LOS - 0   Chief Complaint  Patient presents with  . Shortness of Breath  . Facial Droop  . Aphasia        Subjective:    Darriel Utter today has, No headache, No chest pain, No abdominal pain - No Nausea, No new weakness tingling or numbness, No Cough - SOB.     Assessment  & Plan :     1. 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 Full stroke workup being done, neurology on board, currently on aspirin full dose per neurology, he was also on anticoagulation with Coumadin for a recent PE, INR subtherapeutic and he has been placed on Coumadin with pharmacy monitoring. LDL was over 70 and I have doubled his Lipitor dose, A1c is pending, further recommendations per neurology. Stop ASA once INR 2 per Neuro. D/W Dr Pearlean Brownie.   2. Recent history of PE with hypercoagulable state. Being followed at [facility]. Coumadin with subtherapeutic INR, INR monitoring. Lower extremity venous duplex pending.  3. Dyslipidemia. LDL not at goal Lipitor doubled.  4. Noncompliance with medications. Counseled, he wants to follow at Coumadin clinic locally, we'll set him up with local Coumadin clinic in [place] instead of [facility] upon discharge.  5. Chronic systolic heart failure with EF 25%. On ACE inhibitor continue resume  6. DM type II. A1c pending, continue present regimen of sliding scale and glipizide.  No results found for: HGBA1C  CBG (last 3)  No results for input(s): GLUCAP in the last 72 hours.       Code Status : Full  Family Communication  :  Wife  Disposition Plan  : Likely home tomorrow  Consults  :  Neurology  Procedures  :   CT head, MRI/MRA brain. Confirming right MCA infarct  Echogram  Carotid duplex  Lower extremity venous duplex  DVT Prophylaxis  :  Lovenox    Lab Results  Component Value Date   PLT 296 12/27/2015    Inpatient Medications  Scheduled Meds: . aspirin EC  81 mg Oral Daily  . atorvastatin  80 mg Oral Daily  . enoxaparin (LOVENOX) injection  40 mg Subcutaneous Q24H  . glipiZIDE  10 mg Oral BID WC  . lisinopril  40 mg Oral Daily  . metoprolol succinate  25 mg Oral Daily  . warfarin  20 mg Oral ONCE-1800  . Warfarin - Pharmacist Dosing Inpatient   Does not apply q1800   Continuous Infusions:  PRN Meds:.ondansetron (ZOFRAN) IV, senna-docusate  Antibiotics  :    Anti-infectives    None        Objective:   Filed Vitals:   12/27/15 0228 12/27/15 0520 12/27/15 0629 12/27/15 0816  BP: 114/84 107/77 126/85 133/88  Pulse: 92 88 94 94  Temp: 97.8 F (36.6 C) 98.3 F (36.8 C) 98.2 F (36.8 C)   TempSrc: Oral Oral Oral   Resp: 18 18 18    Height: 6' (1.829 m)     Weight: 96.8  kg (213 lb 6.5 oz)     SpO2: 97% 97% 97%     Wt Readings from Last 3 Encounters:  12/27/15 96.8 kg (213 lb 6.5 oz)    No intake or output data in the 24 hours ending 12/27/15 1100   Physical Exam  Awake Alert, Oriented X 3, No new F.N deficits, Normal affect [place].AT,PERRAL, R. facial droop Supple Neck,No JVD, No cervical lymphadenopathy appriciated.  Symmetrical Chest wall movement, Good air movement bilaterally, CTAB RRR,No Gallops,Rubs or new Murmurs, No Parasternal Heave +ve B.Sounds, Abd Soft, No tenderness, No organomegaly appriciated, No rebound - guarding or rigidity. No Cyanosis, Clubbing or edema, No new Rash or bruise       Data Review:   Micro Results No results found for this or any previous visit (from the past 240 hour(s)).  Radiology Reports Dg Chest 2 View  12/26/2015  CLINICAL  DATA:  Shortness of breath. Bilateral flank pain. Ex-smoker. EXAM: CHEST  2 VIEW COMPARISON:  None. FINDINGS: Enlarged cardiac silhouette. Mild prominence of the pulmonary vasculature and interstitial markings with more prominent interstitial markings in the lower lung zones. Minimal bilateral pleural fluid. Unremarkable bones. IMPRESSION: Cardiomegaly and mild changes of congestive heart failure. Electronically Signed   By: Beckie Salts M.D.   On: 12/26/2015 16:01   Ct Head Wo Contrast  12/26/2015  CLINICAL DATA:  Left arm weakness EXAM: CT HEAD WITHOUT CONTRAST TECHNIQUE: Contiguous axial images were obtained from the base of the skull through the vertex without intravenous contrast. COMPARISON:  None. FINDINGS: The bony calvarium is intact. The ventricles are of normal size and configuration. No findings to suggest acute hemorrhage, acute infarction or space-occupying mass lesion are noted. IMPRESSION: No acute abnormality noted. Electronically Signed   By: Alcide Clever M.D.   On: 12/26/2015 16:16   Mr Maxine Glenn Head Wo Contrast  12/26/2015  CLINICAL DATA:  Initial evaluation for left-sided facial droop. EXAM: MRI HEAD WITHOUT CONTRAST MRA HEAD WITHOUT CONTRAST TECHNIQUE: Multiplanar, multiecho pulse sequences of the brain and surrounding structures were obtained without intravenous contrast. Angiographic images of the head were obtained using MRA technique without contrast. COMPARISON:  Prior CT from earlier the same day. FINDINGS: MRI HEAD FINDINGS Patchy restricted diffusion involving the right frontal lobe, primarily the operculum, with involvement of the superior right insular cortex. Patchy shifted diffusion extend superiorly and medially into the deep white matter of the right corona radiata S/centrum semi ovale. No involvement of the deep gray nuclei. No associated hemorrhage or mass effect. No other areas of infarction. Major intracranial vascular flow voids are maintained. Cerebral volume within normal  limits for patient age. Minimal chronic small vessel ischemic type changes within the periventricular and deep white matter. No chronic infarction. No mass lesion, midline shift or mass effect. No hydrocephalus. No extra-axial fluid collection. No acute or chronic intracranial hemorrhage. Craniocervical junction within normal limits. Pituitary gland normal. No acute abnormality about the orbits. Paranasal sinuses are clear. No mastoid effusion. Inner ear structures normal. Bone marrow signal intensity within normal limits. No scalp soft tissue abnormality. MRA HEAD FINDINGS ANTERIOR CIRCULATION: Distal cervical segments of the internal carotid arteries are widely patent with antegrade flow. Petrous, cavernous, and supraclinoid segments widely patent. A1 segments, anterior communicating artery common anterior cerebral arteries patent without stenosis. M1 segments well opacified without stenosis or thrombosis. MCA bifurcations normal. No proximal M2 branch occlusion. Distal MCA branches symmetric and well opacified. POSTERIOR CIRCULATION: Vertebral arteries patent to the vertebrobasilar junction. Basilar artery widely  patent. Superior cerebellar and posterior cerebral arteries widely patent and well opacified to their distal aspects. Small posterior communicating arteries and about laterally. No aneurysm. IMPRESSION: MRI HEAD IMPRESSION: 1. Patchy acute ischemic multi focal right MCA territory infarcts as above. No significant mass effect or associated hemorrhage. 2. Minimal chronic small vessel ischemic disease. MRA HEAD IMPRESSION: Normal intracranial MRA. No proximal or large arterial branch occlusion. No hemodynamically significant or correctable stenosis Electronically Signed   By: Rise Mu M.D.   On: 12/26/2015 23:27   Mr Brain Wo Contrast  12/26/2015  CLINICAL DATA:  Initial evaluation for left-sided facial droop. EXAM: MRI HEAD WITHOUT CONTRAST MRA HEAD WITHOUT CONTRAST TECHNIQUE: Multiplanar,  multiecho pulse sequences of the brain and surrounding structures were obtained without intravenous contrast. Angiographic images of the head were obtained using MRA technique without contrast. COMPARISON:  Prior CT from earlier the same day. FINDINGS: MRI HEAD FINDINGS Patchy restricted diffusion involving the right frontal lobe, primarily the operculum, with involvement of the superior right insular cortex. Patchy shifted diffusion extend superiorly and medially into the deep white matter of the right corona radiata S/centrum semi ovale. No involvement of the deep gray nuclei. No associated hemorrhage or mass effect. No other areas of infarction. Major intracranial vascular flow voids are maintained. Cerebral volume within normal limits for patient age. Minimal chronic small vessel ischemic type changes within the periventricular and deep white matter. No chronic infarction. No mass lesion, midline shift or mass effect. No hydrocephalus. No extra-axial fluid collection. No acute or chronic intracranial hemorrhage. Craniocervical junction within normal limits. Pituitary gland normal. No acute abnormality about the orbits. Paranasal sinuses are clear. No mastoid effusion. Inner ear structures normal. Bone marrow signal intensity within normal limits. No scalp soft tissue abnormality. MRA HEAD FINDINGS ANTERIOR CIRCULATION: Distal cervical segments of the internal carotid arteries are widely patent with antegrade flow. Petrous, cavernous, and supraclinoid segments widely patent. A1 segments, anterior communicating artery common anterior cerebral arteries patent without stenosis. M1 segments well opacified without stenosis or thrombosis. MCA bifurcations normal. No proximal M2 branch occlusion. Distal MCA branches symmetric and well opacified. POSTERIOR CIRCULATION: Vertebral arteries patent to the vertebrobasilar junction. Basilar artery widely patent. Superior cerebellar and posterior cerebral arteries widely patent  and well opacified to their distal aspects. Small posterior communicating arteries and about laterally. No aneurysm. IMPRESSION: MRI HEAD IMPRESSION: 1. Patchy acute ischemic multi focal right MCA territory infarcts as above. No significant mass effect or associated hemorrhage. 2. Minimal chronic small vessel ischemic disease. MRA HEAD IMPRESSION: Normal intracranial MRA. No proximal or large arterial branch occlusion. No hemodynamically significant or correctable stenosis Electronically Signed   By: Rise Mu M.D.   On: 12/26/2015 23:27     CBC  Recent Labs Lab 12/26/15 1545 12/27/15 0428  WBC 5.5 6.4  HGB 13.9 12.5*  HCT 43.0 39.2  PLT 321 296  MCV 98.4 98.0  MCH 31.8 31.3  MCHC 32.3 31.9  RDW 13.5 13.5  LYMPHSABS 2.1  --   MONOABS 0.2  --   EOSABS 0.1  --   BASOSABS 0.0  --     Chemistries   Recent Labs Lab 12/26/15 1545 12/27/15 0428  NA 143 144  K 4.0 3.7  CL 108 110  CO2 24 25  GLUCOSE 150* 106*  BUN 14 15  CREATININE 1.32* 1.19  CALCIUM 9.2 8.7*  AST 22 18  ALT 45 38  ALKPHOS 109 92  BILITOT 1.1 0.8   ------------------------------------------------------------------------------------------------------------------  Recent  Labs  12/27/15 0428  CHOL 204*  HDL 28*  LDLCALC 158*  TRIG 92  CHOLHDL 7.3    No results found for: HGBA1C ------------------------------------------------------------------------------------------------------------------ No results for input(s): TSH, T4TOTAL, T3FREE, THYROIDAB in the last 72 hours.  Invalid input(s): FREET3 ------------------------------------------------------------------------------------------------------------------ No results for input(s): VITAMINB12, FOLATE, FERRITIN, TIBC, IRON, RETICCTPCT in the last 72 hours.  Coagulation profile  Recent Labs Lab 12/26/15 1545  INR 1.21    No results for input(s): DDIMER in the last 72 hours.  Cardiac Enzymes No results for input(s): CKMB,  TROPONINI, MYOGLOBIN in the last 168 hours.  Invalid input(s): CK ------------------------------------------------------------------------------------------------------------------ No results found for: BNP  Time Spent in minutes  35   SINGH,PRASHANT K M.D on 12/27/2015 at 11:00 AM  Between 7am to 7pm - Pager - [phone]  After 7pm go to [email or web address] - password [facility]  [facility] -  Office  [phone]

## 2015-12-27 NOTE — Progress Notes (Signed)
*  PRELIMINARY RESULTS* Echocardiogram 2D Echocardiogram has been performed.  Ian Foster 12/27/2015, 3:21 PM

## 2015-12-27 NOTE — Progress Notes (Signed)
ANTICOAGULATION CONSULT NOTE - Initial Consult  Pharmacy Consult for warfarin Indication: pulmonary embolus  No Known Allergies  Patient Measurements: Height: 6' (182.9 cm) Weight: 213 lb 6.5 oz (96.8 kg) IBW/kg (Calculated) : 77.6  Vital Signs: Temp: 98.2 F (36.8 C) (01/26 0629) Temp Source: Oral (01/26 0629) BP: 133/88 mmHg (01/26 0816) Pulse Rate: 94 (01/26 0816)  Labs:  Recent Labs  12/26/15 1545 12/27/15 0428  HGB 13.9 12.5*  HCT 43.0 39.2  PLT 321 296  APTT 29  --   LABPROT 15.5*  --   INR 1.21  --   CREATININE 1.32* 1.19    Estimated Creatinine Clearance: 89.6 mL/min (by C-G formula based on Cr of 1.19).  Assessment: 42 YOM admitted with stroke symptoms. Was diagnosed with saddle PE in November 2016 at [facility] and was on warfarin PTA. Per notes, he ran out of warfarin and was not taking it for a few days prior to coming in. INR 1.21 on admission. Home dose is 10mg  daily except 15mg  on Wednesdays.  Per stroke note, NO bridging with Lovenox, can continue prophylactic dosing for now. Also he is to continue ASA 81mg  until INR is therapeutic per stroke team note.  Hgb 12.5, plts 296- no bleeding noted.   Goal of Therapy:  INR 2-3 Monitor platelets by anticoagulation protocol: Yes   Plan:  -warfarin 20mg  po x1 tonight -daily INR -follow CBC and s/s bleeding -continue Lovenox 40mg  subQ q24h daily and ASA 81mg  daily until INR therapeutic  Mickel Schreur D. Heer Justiss, PharmD, BCPS Clinical Pharmacist Pager: [phone] 12/27/2015 10:41 AM

## 2015-12-27 NOTE — Consult Note (Signed)
Name: Ian Foster MRN: [id number] DOB: [birth year]    ADMISSION DATE:  12/26/2015 CONSULTATION DATE:  12/26/2015  REFERRING MD :  Dr. Toniann Fail, [facility]  CHIEF COMPLAINT:  CVA  HISTORY OF PRESENT ILLNESS:  50 year old male with PMH as below, which includes PE diagnosed 10/2015 and protein C, S deficiency. Also DM. He has been on warfarin since time of PE diagnosis and has apparently not been compliant with this. Upon waking from sleep 1/25 he noted uncontrollable drooling from the L side of his mouth. He presented to the ED with these complaints and under went MRI which demonstrated a right MCA distrubution infarct. PCCM has been consulted to weigh in on anticoagulation.  SIGNIFICANT EVENTS  1/25 MCA infarct, admit  STUDIES:  CT head 1/25 > no acute abnormality MRA/MRI 1/25 > Patchy acute ischemic multi focal right MCA territory infarcts as above. No significant mass effect or associated hemorrhage. Minimal chronic small vessel ischemic disease.   PAST MEDICAL HISTORY :   has a past medical history of Pulmonary embolism (HCC); Hypertension; and Diabetes mellitus without complication (HCC).  has no past surgical history on file. Prior to Admission medications   Medication Sig Start Date End Date Taking? Authorizing Provider  aspirin 325 MG tablet Take 325 mg by mouth daily.   Yes Historical Provider, MD  atorvastatin (LIPITOR) 40 MG tablet Take 40 mg by mouth daily. 09/18/15  Yes Historical Provider, MD  furosemide (LASIX) 20 MG tablet Take 20 mg by mouth 2 (two) times daily.   Yes Historical Provider, MD  glipiZIDE (GLUCOTROL) 10 MG tablet Take 10 mg by mouth 2 (two) times daily. 09/18/15  Yes Historical Provider, MD  hydrochlorothiazide (HYDRODIURIL) 25 MG tablet Take 25 mg by mouth daily. 09/18/15 09/17/16 Yes Historical Provider, MD  lisinopril (PRINIVIL,ZESTRIL) 40 MG tablet Take 40 mg by mouth daily.   Yes Historical Provider, MD  metFORMIN (GLUCOPHAGE) 1000 MG tablet Take 1,000 mg  by mouth 2 (two) times daily. 09/18/15  Yes Historical Provider, MD  metoprolol succinate (TOPROL-XL) 25 MG 24 hr tablet Take 25 mg by mouth daily. 12/05/15  Yes Historical Provider, MD  warfarin (COUMADIN) 5 MG tablet Take 10-15 mg by mouth See admin instructions. 10 mg on all other days except on Wednesday patient to take 15 mg 12/20/15  Yes Historical Provider, MD   No Known Allergies  FAMILY HISTORY:  family history is not on file. SOCIAL HISTORY:  reports that he has been smoking.  He does not have any smokeless tobacco history on file. He reports that he does not drink alcohol or use illicit drugs.  REVIEW OF SYSTEMS:   Constitutional: Negative for fever, chills, weight loss, malaise/fatigue and diaphoresis.  HENT: Negative for hearing loss, ear pain, nosebleeds, congestion, sore throat, neck pain, tinnitus and ear discharge.   Eyes: Negative for blurred vision, double vision, photophobia, pain, discharge and redness.  Respiratory: Negative for cough, hemoptysis, sputum production, shortness of breath, wheezing and stridor.   Cardiovascular: Negative for chest pain, palpitations, orthopnea, claudication, leg swelling and PND.  Gastrointestinal: Negative for heartburn, nausea, vomiting, abdominal pain, diarrhea, constipation, blood in stool and melena.  Genitourinary: Negative for dysuria, urgency, frequency, hematuria and flank pain.  Musculoskeletal: Negative for myalgias, back pain, joint pain and falls.  Skin: Negative for itching and rash.  Neurological: Negative for dizziness, tingling, tremors, sensory change, speech change, focal weakness, seizures, loss of consciousness, weakness and headaches. drooling Endo/Heme/Allergies: Negative for environmental allergies and polydipsia. Does not  bruise/bleed easily.  SUBJECTIVE:   VITAL SIGNS: Temp:  [98.1 F (36.7 C)] 98.1 F (36.7 C) (01/25 1540) Pulse Rate:  [93-113] 98 (01/26 0115) Resp:  [18-33] 23 (01/26 0115) BP:  (107-130)/(85-103) 123/92 mmHg (01/26 0115) SpO2:  [85 %-98 %] 91 % (01/26 0115)  PHYSICAL EXAMINATION: General:  Obese male in NAD Neuro: alert, oriented, dysarthric  HEENT:  [place]/AT, PERRL, no JVD Cardiovascular:  RRR, no MRG Lungs:  Clear bilateral breath sounds Abdomen:  Soft, non-tender, non-distended Musculoskeletal:  No acute deformity or ROM limitation. Skin:  Grossly intact   Recent Labs Lab 12/26/15 1545  NA 143  K 4.0  CL 108  CO2 24  BUN 14  CREATININE 1.32*  GLUCOSE 150*    Recent Labs Lab 12/26/15 1545  HGB 13.9  HCT 43.0  WBC 5.5  PLT 321   Dg Chest 2 View  12/26/2015  CLINICAL DATA:  Shortness of breath. Bilateral flank pain. Ex-smoker. EXAM: CHEST  2 VIEW COMPARISON:  None. FINDINGS: Enlarged cardiac silhouette. Mild prominence of the pulmonary vasculature and interstitial markings with more prominent interstitial markings in the lower lung zones. Minimal bilateral pleural fluid. Unremarkable bones. IMPRESSION: Cardiomegaly and mild changes of congestive heart failure. Electronically Signed   By: Beckie Salts M.D.   On: 12/26/2015 16:01   Ct Head Wo Contrast  12/26/2015  CLINICAL DATA:  Left arm weakness EXAM: CT HEAD WITHOUT CONTRAST TECHNIQUE: Contiguous axial images were obtained from the base of the skull through the vertex without intravenous contrast. COMPARISON:  None. FINDINGS: The bony calvarium is intact. The ventricles are of normal size and configuration. No findings to suggest acute hemorrhage, acute infarction or space-occupying mass lesion are noted. IMPRESSION: No acute abnormality noted. Electronically Signed   By: Alcide Clever M.D.   On: 12/26/2015 16:16   Mr Maxine Glenn Head Wo Contrast  12/26/2015  CLINICAL DATA:  Initial evaluation for left-sided facial droop. EXAM: MRI HEAD WITHOUT CONTRAST MRA HEAD WITHOUT CONTRAST TECHNIQUE: Multiplanar, multiecho pulse sequences of the brain and surrounding structures were obtained without intravenous contrast.  Angiographic images of the head were obtained using MRA technique without contrast. COMPARISON:  Prior CT from earlier the same day. FINDINGS: MRI HEAD FINDINGS Patchy restricted diffusion involving the right frontal lobe, primarily the operculum, with involvement of the superior right insular cortex. Patchy shifted diffusion extend superiorly and medially into the deep white matter of the right corona radiata S/centrum semi ovale. No involvement of the deep gray nuclei. No associated hemorrhage or mass effect. No other areas of infarction. Major intracranial vascular flow voids are maintained. Cerebral volume within normal limits for patient age. Minimal chronic small vessel ischemic type changes within the periventricular and deep white matter. No chronic infarction. No mass lesion, midline shift or mass effect. No hydrocephalus. No extra-axial fluid collection. No acute or chronic intracranial hemorrhage. Craniocervical junction within normal limits. Pituitary gland normal. No acute abnormality about the orbits. Paranasal sinuses are clear. No mastoid effusion. Inner ear structures normal. Bone marrow signal intensity within normal limits. No scalp soft tissue abnormality. MRA HEAD FINDINGS ANTERIOR CIRCULATION: Distal cervical segments of the internal carotid arteries are widely patent with antegrade flow. Petrous, cavernous, and supraclinoid segments widely patent. A1 segments, anterior communicating artery common anterior cerebral arteries patent without stenosis. M1 segments well opacified without stenosis or thrombosis. MCA bifurcations normal. No proximal M2 branch occlusion. Distal MCA branches symmetric and well opacified. POSTERIOR CIRCULATION: Vertebral arteries patent to the vertebrobasilar junction. Basilar artery  widely patent. Superior cerebellar and posterior cerebral arteries widely patent and well opacified to their distal aspects. Small posterior communicating arteries and about laterally. No  aneurysm. IMPRESSION: MRI HEAD IMPRESSION: 1. Patchy acute ischemic multi focal right MCA territory infarcts as above. No significant mass effect or associated hemorrhage. 2. Minimal chronic small vessel ischemic disease. MRA HEAD IMPRESSION: Normal intracranial MRA. No proximal or large arterial branch occlusion. No hemodynamically significant or correctable stenosis Electronically Signed   By: Rise Mu M.D.   On: 12/26/2015 23:27   Mr Brain Wo Contrast  12/26/2015  CLINICAL DATA:  Initial evaluation for left-sided facial droop. EXAM: MRI HEAD WITHOUT CONTRAST MRA HEAD WITHOUT CONTRAST TECHNIQUE: Multiplanar, multiecho pulse sequences of the brain and surrounding structures were obtained without intravenous contrast. Angiographic images of the head were obtained using MRA technique without contrast. COMPARISON:  Prior CT from earlier the same day. FINDINGS: MRI HEAD FINDINGS Patchy restricted diffusion involving the right frontal lobe, primarily the operculum, with involvement of the superior right insular cortex. Patchy shifted diffusion extend superiorly and medially into the deep white matter of the right corona radiata S/centrum semi ovale. No involvement of the deep gray nuclei. No associated hemorrhage or mass effect. No other areas of infarction. Major intracranial vascular flow voids are maintained. Cerebral volume within normal limits for patient age. Minimal chronic small vessel ischemic type changes within the periventricular and deep white matter. No chronic infarction. No mass lesion, midline shift or mass effect. No hydrocephalus. No extra-axial fluid collection. No acute or chronic intracranial hemorrhage. Craniocervical junction within normal limits. Pituitary gland normal. No acute abnormality about the orbits. Paranasal sinuses are clear. No mastoid effusion. Inner ear structures normal. Bone marrow signal intensity within normal limits. No scalp soft tissue abnormality. MRA HEAD  FINDINGS ANTERIOR CIRCULATION: Distal cervical segments of the internal carotid arteries are widely patent with antegrade flow. Petrous, cavernous, and supraclinoid segments widely patent. A1 segments, anterior communicating artery common anterior cerebral arteries patent without stenosis. M1 segments well opacified without stenosis or thrombosis. MCA bifurcations normal. No proximal M2 branch occlusion. Distal MCA branches symmetric and well opacified. POSTERIOR CIRCULATION: Vertebral arteries patent to the vertebrobasilar junction. Basilar artery widely patent. Superior cerebellar and posterior cerebral arteries widely patent and well opacified to their distal aspects. Small posterior communicating arteries and about laterally. No aneurysm. IMPRESSION: MRI HEAD IMPRESSION: 1. Patchy acute ischemic multi focal right MCA territory infarcts as above. No significant mass effect or associated hemorrhage. 2. Minimal chronic small vessel ischemic disease. MRA HEAD IMPRESSION: Normal intracranial MRA. No proximal or large arterial branch occlusion. No hemodynamically significant or correctable stenosis Electronically Signed   By: Rise Mu M.D.   On: 12/26/2015 23:27    ASSESSMENT / PLAN:  Acute R MCA infarct - management per neurology  Subacute pulmonary embolism secondary to protein C, protein S deficiencies. Complicated case in setting hypercoag state. Typical recommendation in setting of acute CVA is hold anticoagulation for 2 weeks given risk for hemorrhagic conversion.  - no anticoag for 2 weeks from 12/26/15. Will need to restart systemic anticoag w coumadin in 2 weeks time.  - would perform B LE venous dopplers. If positive for DVT will need to consider pros / cons of a removable IVC filter. If negative then would not put through risk of filter - Supplemental O2 PRN to keep sat > 92% - Echo pending  Diabetes Mellitus - management per primary  Joneen Roach, AGACNP-BC [facility]  Pulmonology/Critical Care Pager [phone]  or [phone]  12/27/2015 2:28 AM  Attending Note:  I have examined patient, reviewed labs, studies and notes. I have discussed the case with Henreitta Leber, and I agree with the data and plans as amended above. Pt with hx hypercoag state, PE. Unclear whether he was taking coumadin reliably but he will need lifelong anticoag rx. Now with acute CVA. HE WILL NEED TO BE OFF ALL ANTICOAG FOR 2 WEEKS (STARTING 1/25). I recommend B LE dopplers to assess for DVT. If present we will have to make a decision regarding an IVC filter. Will follow.   Levy Pupa, MD, PhD 12/27/2015, 8:34 AM [facility] [phone] or if no answer [phone]

## 2015-12-27 NOTE — Progress Notes (Signed)
*  PRELIMINARY RESULTS* Vascular Ultrasound Carotid Duplex (Doppler) has been completed.  Preliminary findings: Bilateral: No significant (1-39%) ICA stenosis. Antegrade vertebral flow.    Farrel Demark, RDMS, RVT  12/27/2015, 3:46 PM

## 2015-12-27 NOTE — Progress Notes (Signed)
eLink Physician-Brief Progress Note Patient Name: Ian Foster DOB: June 12, 1966 MRN: [id number]   Date of Service  12/27/2015  HPI/Events of Note  50 yo with protein C def and h/o PE, admitted for acute STROKE/Infarct, we were asked to consult on anticoagulation of PE,  not hypoxic at rest, VS stable  eICU Interventions  RECOMMENDATION:NOT TO ANTICOAGULATE DUE TO INCREASE RISK FOR HEMM TRANSFORMATION OF INFARCT, follow up neuro recs        Navil Kole 12/27/2015, 1:32 AM

## 2015-12-27 NOTE — Progress Notes (Signed)
Initial Nutrition Assessment  DOCUMENTATION CODES:   Not applicable  INTERVENTION:   Ensure Enlive po BID, each supplement provides 350 kcal and 20 grams of protein  NUTRITION DIAGNOSIS:   Inadequate oral intake related to poor appetite as evidenced by meal completion < 50%.  GOAL:   Patient will meet greater than or equal to 90% of their needs   MONITOR:   PO intake, Supplement acceptance, Labs, Weight trends, Skin, I & O's  REASON FOR ASSESSMENT:   Malnutrition Screening Tool    ASSESSMENT:   Ian Foster is a 50 y.o. 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 Denies any associated difficulty swallowing speaking or any weakness of the upper or lower extremity. Facial droop is mostly on the left lower part of the face. Patient came late in the evening around 5:30 to the ER. MRI of the brain shows acute stroke involving the right MCA territory and on-call neurologist has been consulted and patient has been admitted for further stroke workup. Patient has not taken his Coumadin for last couple of days has patient had run out of it. INR is subtherapeutic. Patient denies any chest pain or shortness of breath.   Pt admitted with acute rt MCA infarct.   Spoke with pt at bedside, who endorses poor appetite and weight loss over the past 2 months. He reveals that he hasn't had an appetite since being diagnosed with "blood clots" 2 months ago. He reveals that he has lost 30# within the past 2 months. Reviewed records from "[facility] Everywhere", which revealed wt of 224# on 09/18/15; this signifies a 4.9% wt loss x 3 months, which is not significant for time frame.   Pt denies any chewing or swallowing difficulties; her reports he just doesn't feel like eating. Noted lunch tray at bedside has been  unattempted. Also revealed multiple containers of outside food, which were also no consumed. Pt reveals it is easier for him to drink than eat at this time. He is amenable to consuming Ensure Enlive supplements. Discussed importance of consuming meals and supplements to promote healing.   Labs reviewed.   Diet Order:  Diet Carb Modified Fluid consistency:: Thin; Room service appropriate?: Yes  Skin:  Reviewed, no issues  Last BM:  12/26/15  Height:   Ht Readings from Last 1 Encounters:  12/27/15 6' (1.829 m)    Weight:   Wt Readings from Last 1 Encounters:  12/27/15 213 lb 6.5 oz (96.8 kg)    Ideal Body Weight:  80.9 kg  BMI:  Body mass index is 28.94 kg/(m^2).  Estimated Nutritional Needs:   Kcal:  1900-2100  Protein:  100-110 grams  Fluid:  1.9-2.1 L  EDUCATION NEEDS:   Education needs addressed  Ian Foster, RD, LDN, CDE Pager: [phone] After hours Pager: [phone]

## 2015-12-27 NOTE — Progress Notes (Signed)
STROKE TEAM PROGRESS NOTE   HISTORY OF PRESENT ILLNESS Ian Foster is an 50 y.o. male hx of HTN, DM, PE (on coumadin) presenting with sudden onset of left-sided facial droop. Reports waking up in the morning and noted that he could not control drool coming out of the left side of his mouth. MRI brain imaging reviewed, shows right MCA distribution infarct. (LKW 12/25/2015, time unknown). 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 He ran out a few days ago and has not been taking it. INR 1.21 in the ED. Per [facility] of [facility] he was diagnosed with a large saddle PE in November 2016. Modified Rankin: Rankin Score=0. Patient was not administered TPA secondary to outside tPA window. He was admitted for further evaluation and treatment.   SUBJECTIVE (INTERVAL HISTORY) His friend is at the bedside. Permission given to speak in front of her by pt.  Overall he feels his condition is stable. He is asking about traveling.   OBJECTIVE Temp:  [97.8 F (36.6 C)-98.3 F (36.8 C)] 98.2 F (36.8 C) (01/26 0629) Pulse Rate:  [88-113] 94 (01/26 0816) Cardiac Rhythm:  [-] Normal sinus rhythm (01/26 0702) Resp:  [18-33] 18 (01/26 0629) BP: (107-133)/(77-103) 133/88 mmHg (01/26 0816) SpO2:  [85 %-98 %] 97 % (01/26 0629) Weight:  [96.8 kg (213 lb 6.5 oz)] 96.8 kg (213 lb 6.5 oz) (01/26 0228)  CBC:   Recent Labs Lab 12/26/15 1545 12/27/15 0428  WBC 5.5 6.4  NEUTROABS 3.1  --   HGB 13.9 12.5*  HCT 43.0 39.2  MCV 98.4 98.0  PLT 321 296    Basic Metabolic Panel:   Recent Labs Lab 12/26/15 1545 12/27/15 0428  NA 143 144  K 4.0 3.7  CL 108 110  CO2 24 25  GLUCOSE 150* 106*  BUN 14 15  CREATININE 1.32* 1.19  CALCIUM 9.2 8.7*    Lipid Panel:     Component Value Date/Time   CHOL 204* 12/27/2015 0428   TRIG 92 12/27/2015 0428   HDL 28* 12/27/2015 0428   CHOLHDL 7.3 12/27/2015  0428   VLDL 18 12/27/2015 0428   LDLCALC 158* 12/27/2015 0428   HgbA1c: No results found for: HGBA1C Urine Drug Screen: No results found for: LABOPIA, COCAINSCRNUR, LABBENZ, AMPHETMU, THCU, LABBARB    IMAGING  Dg Chest 2 View 12/26/2015  Cardiomegaly and mild changes of congestive heart failure.   Ct Head Wo Contrast 12/26/2015   No acute abnormality noted.   MRI HEAD  12/26/2015   1. Patchy acute ischemic multi focal right MCA territory infarcts as above. No significant mass effect or associated hemorrhage. 2. Minimal chronic small vessel ischemic disease.   MRA HEAD  12/26/2015   Normal intracranial MRA. No proximal or large arterial branch occlusion. No hemodynamically significant or correctable stenosis    PHYSICAL EXAM Middle-aged African-American male currently not in distress. . Afebrile. Head is nontraumatic. Neck is supple without bruit.    Cardiac exam no murmur or gallop. Lungs are clear to auscultation. Distal pulses are well felt. Neurological Exam :  Awake alert oriented x 3 normal speech and language. Pupils are equal reactive. Extraocular movements are full range without nystagmus. Vision acuity is adequate. Visual fields are full to bedside confrontational testing. Fundi were not visualized. Mild left lower face asymmetry. Tongue midline. No drift. Mild diminished fine finger movements on left. Orbits  right over left upper extremity. Mild left grip weak.. Normal sensation . Normal coordination. ASSESSMENT/PLAN Mr. Ian Foster is a 50 y.o. male with history of HTN, DM, Prot C&S deficiency with PE (on coumadin)  presenting with facial droop. He did not receive IV t-PA due to delay in arrival.   Stroke:   Patchy R MCA territory infarcts embolic secondary to hypercoagulable state  Resultant  L FMM, L facial droop  MRI  Patchy R MCA territory infarcts  MRA  normal  Carotid Doppler  pending   2D Echo  pending   LDL 158  HgbA1c pending  Lovenox 40 mg sq daily for  VTE prophylaxis Diet Carb Modified Fluid consistency:: Thin; Room service appropriate?: Yes  aspirin 325 mg daily and warfarin daily prior to admission but had ran out of warfarin, not taking for a few days prior to admission, INR on admission 1.21   now on aspirin 325 mg daily. Warfarin currently on hold   Ok to resume warfarin from the stroke standpoint. Recommend aspirin 81 mg daily until warfarin therapeutic. Do not recommend lovenox bridge.  Patient counseled to be compliant with his antithrombotic medications because he has been told by other MDs this admission to hold warfarin x 2 weeks. It is ok to resume now.  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 I have made any additions or clarifications directly to the above note. Agree with note above.  He presented with left hemiplegia secondary to right MCA branch infarct likely related to hypercoagulability from his protein S and C deficiency and being off anticoagulation. He remains at risk for neurological worsening, recurrent stroke, TIA, DVT, pulmonary embolism and needs ongoing stroke evaluation and  anticoagulation long-term. Recommend start warfarin and continue aspirin until INR is optimal then discontinue aspirin. No need for bridging with Lovenox due to increased risk of hemorrhagic transformation. Ian Foster Heady, MD Medical Director [facility] Pager: [phone] 12/27/2015 3:50 PM    To contact Stroke Continuity provider, please refer to [email or web address]. After hours, contact General Neurology

## 2015-12-27 NOTE — H&P (Signed)
[facility] History and Physical  Ian Foster ZOX:[id number] DOB: 26-Dec-1965 DOA: 12/26/2015  Referring physician: Ms. Trisha Mangle. PCP: No primary care provider on file. Dr. Andrey Campanile. In [place]. Specialists: Patient follows up with cardiologist in [place].  Chief Complaint: Left facial droop.  HPI: Ian Foster is a 50 y.o. 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 Denies any associated difficulty swallowing speaking or any weakness of the upper or lower extremity. Facial droop is mostly on the left lower part of the face. Patient came late in the evening around 5:30 to the ER. MRI of the brain shows acute stroke involving the right MCA territory and on-call neurologist has been consulted and patient has been admitted for further stroke workup. Patient has not taken his Coumadin for last couple of days has patient had run out of it. INR is subtherapeutic. Patient denies any chest pain or shortness of breath.   Review of Systems: As presented in the history of presenting illness, rest negative.  Past Medical History  Diagnosis Date  . Pulmonary embolism (HCC)   . Hypertension   . Diabetes mellitus without complication (HCC)   . CHF (congestive heart failure) (HCC)    History reviewed. No pertinent past surgical history. Social History:  reports that he has quit smoking. He does not have any smokeless tobacco history on file. He reports that he does not drink alcohol or use illicit drugs. Where does patient live home. Can patient participate in ADLs? Yes.  No Known Allergies  Family History:  Family History  Problem Relation Age of Onset  . Hypertension Mother       Prior to Admission medications   Medication Sig Start Date End Date Taking? Authorizing Provider   aspirin 325 MG tablet Take 325 mg by mouth daily.   Yes Historical Provider, MD  atorvastatin (LIPITOR) 40 MG tablet Take 40 mg by mouth daily. 09/18/15  Yes Historical Provider, MD  furosemide (LASIX) 20 MG tablet Take 20 mg by mouth 2 (two) times daily.   Yes Historical Provider, MD  glipiZIDE (GLUCOTROL) 10 MG tablet Take 10 mg by mouth 2 (two) times daily. 09/18/15  Yes Historical Provider, MD  hydrochlorothiazide (HYDRODIURIL) 25 MG tablet Take 25 mg by mouth daily. 09/18/15 09/17/16 Yes Historical Provider, MD  lisinopril (PRINIVIL,ZESTRIL) 40 MG tablet Take 40 mg by mouth daily.   Yes Historical Provider, MD  metFORMIN (GLUCOPHAGE) 1000 MG tablet Take 1,000 mg by mouth 2 (two) times daily. 09/18/15  Yes Historical Provider, MD  metoprolol succinate (TOPROL-XL) 25 MG 24 hr tablet Take 25 mg by mouth daily. 12/05/15  Yes Historical Provider, MD  warfarin (COUMADIN) 5 MG tablet Take 10-15 mg by mouth See admin instructions. 10 mg on all other days except on Wednesday patient to take 15 mg 12/20/15  Yes Historical Provider, MD    Physical Exam: Filed Vitals:   12/27/15 0000 12/27/15 0015 12/27/15 0030 12/27/15 0115  BP: 125/92 128/94 127/89 123/92  Pulse: 94 95 93 98  Temp:      TempSrc:      Resp: 28 29 30 23   SpO2: 91% 91% 93% 91%     General:  Moderately built and nourished.  Eyes: Anicteric no pallor.  ENT: No discharge from the ears eyes nose or mouth.  Neck: No  mass felt. No neck rigidity.  Cardiovascular: S1 and S2 heard.  Respiratory: No rhonchi or crepitations.  Abdomen: Soft nontender bowel sounds present.  Skin: No rash.  Musculoskeletal: No edema.  Psychiatric: Appears normal.  Neurologic: Alert awake oriented to time place and person. Left facial droop. PERRLA positive. Moves all extremities 5 x 5. Tongue is midline.  Labs on Admission:  Basic Metabolic Panel:  Recent Labs Lab 12/26/15 1545  NA 143  K 4.0  CL 108  CO2 24  GLUCOSE 150*  BUN 14   CREATININE 1.32*  CALCIUM 9.2   Liver Function Tests:  Recent Labs Lab 12/26/15 1545  AST 22  ALT 45  ALKPHOS 109  BILITOT 1.1  PROT 6.7  ALBUMIN 3.7   No results for input(s): LIPASE, AMYLASE in the last 168 hours. No results for input(s): AMMONIA in the last 168 hours. CBC:  Recent Labs Lab 12/26/15 1545  WBC 5.5  NEUTROABS 3.1  HGB 13.9  HCT 43.0  MCV 98.4  PLT 321   Cardiac Enzymes: No results for input(s): CKTOTAL, CKMB, CKMBINDEX, TROPONINI in the last 168 hours.  BNP (last 3 results) No results for input(s): BNP in the last 8760 hours.  ProBNP (last 3 results) No results for input(s): PROBNP in the last 8760 hours.  CBG: No results for input(s): GLUCAP in the last 168 hours.  Radiological Exams on Admission: Dg Chest 2 View  12/26/2015  CLINICAL DATA:  Shortness of breath. Bilateral flank pain. Ex-smoker. EXAM: CHEST  2 VIEW COMPARISON:  None. FINDINGS: Enlarged cardiac silhouette. Mild prominence of the pulmonary vasculature and interstitial markings with more prominent interstitial markings in the lower lung zones. Minimal bilateral pleural fluid. Unremarkable bones. IMPRESSION: Cardiomegaly and mild changes of congestive heart failure. Electronically Signed   By: Beckie Salts M.D.   On: 12/26/2015 16:01   Ct Head Wo Contrast  12/26/2015  CLINICAL DATA:  Left arm weakness EXAM: CT HEAD WITHOUT CONTRAST TECHNIQUE: Contiguous axial images were obtained from the base of the skull through the vertex without intravenous contrast. COMPARISON:  None. FINDINGS: The bony calvarium is intact. The ventricles are of normal size and configuration. No findings to suggest acute hemorrhage, acute infarction or space-occupying mass lesion are noted. IMPRESSION: No acute abnormality noted. Electronically Signed   By: Alcide Clever M.D.   On: 12/26/2015 16:16   Mr Maxine Glenn Head Wo Contrast  12/26/2015  CLINICAL DATA:  Initial evaluation for left-sided facial droop. EXAM: MRI HEAD  WITHOUT CONTRAST MRA HEAD WITHOUT CONTRAST TECHNIQUE: Multiplanar, multiecho pulse sequences of the brain and surrounding structures were obtained without intravenous contrast. Angiographic images of the head were obtained using MRA technique without contrast. COMPARISON:  Prior CT from earlier the same day. FINDINGS: MRI HEAD FINDINGS Patchy restricted diffusion involving the right frontal lobe, primarily the operculum, with involvement of the superior right insular cortex. Patchy shifted diffusion extend superiorly and medially into the deep white matter of the right corona radiata S/centrum semi ovale. No involvement of the deep gray nuclei. No associated hemorrhage or mass effect. No other areas of infarction. Major intracranial vascular flow voids are maintained. Cerebral volume within normal limits for patient age. Minimal chronic small vessel ischemic type changes within the periventricular and deep white matter. No chronic infarction. No mass lesion, midline shift or mass effect. No hydrocephalus. No extra-axial fluid collection. No acute or chronic intracranial hemorrhage. Craniocervical junction within normal limits. Pituitary gland normal. No acute abnormality about the orbits. Paranasal sinuses  are clear. No mastoid effusion. Inner ear structures normal. Bone marrow signal intensity within normal limits. No scalp soft tissue abnormality. MRA HEAD FINDINGS ANTERIOR CIRCULATION: Distal cervical segments of the internal carotid arteries are widely patent with antegrade flow. Petrous, cavernous, and supraclinoid segments widely patent. A1 segments, anterior communicating artery common anterior cerebral arteries patent without stenosis. M1 segments well opacified without stenosis or thrombosis. MCA bifurcations normal. No proximal M2 branch occlusion. Distal MCA branches symmetric and well opacified. POSTERIOR CIRCULATION: Vertebral arteries patent to the vertebrobasilar junction. Basilar artery widely  patent. Superior cerebellar and posterior cerebral arteries widely patent and well opacified to their distal aspects. Small posterior communicating arteries and about laterally. No aneurysm. IMPRESSION: MRI HEAD IMPRESSION: 1. Patchy acute ischemic multi focal right MCA territory infarcts as above. No significant mass effect or associated hemorrhage. 2. Minimal chronic small vessel ischemic disease. MRA HEAD IMPRESSION: Normal intracranial MRA. No proximal or large arterial branch occlusion. No hemodynamically significant or correctable stenosis Electronically Signed   By: Rise Mu M.D.   On: 12/26/2015 23:27   Mr Brain Wo Contrast  12/26/2015  CLINICAL DATA:  Initial evaluation for left-sided facial droop. EXAM: MRI HEAD WITHOUT CONTRAST MRA HEAD WITHOUT CONTRAST TECHNIQUE: Multiplanar, multiecho pulse sequences of the brain and surrounding structures were obtained without intravenous contrast. Angiographic images of the head were obtained using MRA technique without contrast. COMPARISON:  Prior CT from earlier the same day. FINDINGS: MRI HEAD FINDINGS Patchy restricted diffusion involving the right frontal lobe, primarily the operculum, with involvement of the superior right insular cortex. Patchy shifted diffusion extend superiorly and medially into the deep white matter of the right corona radiata S/centrum semi ovale. No involvement of the deep gray nuclei. No associated hemorrhage or mass effect. No other areas of infarction. Major intracranial vascular flow voids are maintained. Cerebral volume within normal limits for patient age. Minimal chronic small vessel ischemic type changes within the periventricular and deep white matter. No chronic infarction. No mass lesion, midline shift or mass effect. No hydrocephalus. No extra-axial fluid collection. No acute or chronic intracranial hemorrhage. Craniocervical junction within normal limits. Pituitary gland normal. No acute abnormality about the  orbits. Paranasal sinuses are clear. No mastoid effusion. Inner ear structures normal. Bone marrow signal intensity within normal limits. No scalp soft tissue abnormality. MRA HEAD FINDINGS ANTERIOR CIRCULATION: Distal cervical segments of the internal carotid arteries are widely patent with antegrade flow. Petrous, cavernous, and supraclinoid segments widely patent. A1 segments, anterior communicating artery common anterior cerebral arteries patent without stenosis. M1 segments well opacified without stenosis or thrombosis. MCA bifurcations normal. No proximal M2 branch occlusion. Distal MCA branches symmetric and well opacified. POSTERIOR CIRCULATION: Vertebral arteries patent to the vertebrobasilar junction. Basilar artery widely patent. Superior cerebellar and posterior cerebral arteries widely patent and well opacified to their distal aspects. Small posterior communicating arteries and about laterally. No aneurysm. IMPRESSION: MRI HEAD IMPRESSION: 1. Patchy acute ischemic multi focal right MCA territory infarcts as above. No significant mass effect or associated hemorrhage. 2. Minimal chronic small vessel ischemic disease. MRA HEAD IMPRESSION: Normal intracranial MRA. No proximal or large arterial branch occlusion. No hemodynamically significant or correctable stenosis Electronically Signed   By: Rise Mu M.D.   On: 12/26/2015 23:27    EKG: Independently reviewed. Sinus tachycardia. Nonspecific ST-T changes.  Assessment/Plan Principal Problem:   Acute right MCA stroke (HCC) Active Problems:   CVA (cerebral infarction)   LV dysfunction   Type 2 diabetes mellitus with vascular  disease (HCC)   Hypertension   History of pulmonary embolism   Stroke (cerebrum) (HCC)   1. Acute right MCA stroke - appreciate neurology and pulmonary consult. At this time neurologist has recommended aspirin. Since patient had acute stroke pulmonologist has recommended not to anticoagulate for now. Check  carotid Doppler and 2-D echo. Check lipid panel and hemoglobin A1c. Further recommendations per neurologist. 2. History of protein C and S deficiency with history of PE and DVT - last PE was in November last year. At this time pulmonologist as recommended to hold off anticoagulation due to acute stroke. Patient's INR was subtherapeutic secondary to patient not having taken his Coumadin last 2 days. 3. LV dysfunction last Nelma Rothman shows 20-25% 2 weeks ago with hypertension - holding of Lasix and HCTZ due to acute stroke. Will continue Ace inhibitors. 4. Diabetes mellitus type 2 - continue glipizide but will hold metformin while inpatient. Patient is on sliding scale coverage. Patient states his hemoglobin A1c was 7 recently. 5. Hyperlipidemia on statins.   DVT Prophylaxis Lovenox.  Code Status: Full code.  Family Communication: Discussed with patient.  Disposition Plan: Admit to inpatient.    Ian Foster N. [facility] Pager [phone].  If 7PM-7AM, please contact night-coverage [email or web address] Password [email or web address] 12/27/2015, 2:13 AM

## 2015-12-27 NOTE — Care Management Note (Signed)
Case Management Note  Patient Details  Name: Ian Foster MRN: [id number] Date of Birth: 1966-11-09  Subjective/Objective:                  Spoke to patient at the bedside. He states that he has [employer] coverage until 1/28 and then will pick up with [facility] March 1st. He states that he has a PCP, Dr Andrey Campanile at [facility], and obtains his medications at little to no cost from the [facility] in [place]. He states that he lives in [place] with his wife in a first floor apartment. He has no DME and expects that he will need some as he has had weakness from his +CVA this admission. PT eval pending.    Action/Plan:  Anticipate needs for DME and HH PT. Will continue to follow. Expected Discharge Date:                  Expected Discharge Plan:  Home w Home Health Services  In-House Referral:     Discharge planning Services  CM Consult  Post Acute Care Choice:    Choice offered to:     DME Arranged:    DME Agency:     HH Arranged:    HH Agency:     Status of Service:  In process, will continue to follow  Medicare Important Message Given:    Date Medicare IM Given:    Medicare IM give by:    Date Additional Medicare IM Given:    Additional Medicare Important Message give by:     If discussed at Long Length of Stay Meetings, dates discussed:    Additional Comments:  Lawerance Sabal, RN 12/27/2015, 12:21 PM

## 2015-12-28 ENCOUNTER — Encounter (HOSPITAL_COMMUNITY): Payer: Self-pay | Admitting: Physician Assistant

## 2015-12-28 DIAGNOSIS — I5023 Acute on chronic systolic (congestive) heart failure: Secondary | ICD-10-CM

## 2015-12-28 DIAGNOSIS — I429 Cardiomyopathy, unspecified: Secondary | ICD-10-CM

## 2015-12-28 DIAGNOSIS — R0902 Hypoxemia: Secondary | ICD-10-CM

## 2015-12-28 DIAGNOSIS — E785 Hyperlipidemia, unspecified: Secondary | ICD-10-CM

## 2015-12-28 DIAGNOSIS — I428 Other cardiomyopathies: Secondary | ICD-10-CM

## 2015-12-28 DIAGNOSIS — I2510 Atherosclerotic heart disease of native coronary artery without angina pectoris: Secondary | ICD-10-CM

## 2015-12-28 LAB — CBC
HCT: 41.5 % (ref 39.0–52.0)
HEMOGLOBIN: 13.7 g/dL (ref 13.0–17.0)
MCH: 32.2 pg (ref 26.0–34.0)
MCHC: 33 g/dL (ref 30.0–36.0)
MCV: 97.6 fL (ref 78.0–100.0)
PLATELETS: 287 10*3/uL (ref 150–400)
RBC: 4.25 MIL/uL (ref 4.22–5.81)
RDW: 13.5 % (ref 11.5–15.5)
WBC: 5.5 10*3/uL (ref 4.0–10.5)

## 2015-12-28 LAB — GLUCOSE, CAPILLARY
GLUCOSE-CAPILLARY: 96 mg/dL (ref 65–99)
GLUCOSE-CAPILLARY: 73 mg/dL (ref 65–99)
Glucose-Capillary: 157 mg/dL — ABNORMAL HIGH (ref 65–99)
Glucose-Capillary: 82 mg/dL (ref 65–99)
Glucose-Capillary: 201 mg/dL — ABNORMAL HIGH (ref 65–99)
Glucose-Capillary: 95 mg/dL (ref 65–99)

## 2015-12-28 LAB — HEMOGLOBIN A1C
HEMOGLOBIN A1C: 6 % — AB (ref 4.8–5.6)
Mean Plasma Glucose: 126 mg/dL

## 2015-12-28 LAB — TSH: TSH: 0.316 u[IU]/mL — ABNORMAL LOW (ref 0.350–4.500)

## 2015-12-28 LAB — PROTIME-INR
INR: 1.29 (ref 0.00–1.49)
PROTHROMBIN TIME: 16.2 s — AB (ref 11.6–15.2)

## 2015-12-28 LAB — T4, FREE: FREE T4: 1.05 ng/dL (ref 0.61–1.12)

## 2015-12-28 MED ORDER — FUROSEMIDE 10 MG/ML IJ SOLN
40.0000 mg | Freq: Once | INTRAMUSCULAR | Status: AC
  Administered 2015-12-28: 40 mg via INTRAVENOUS
  Filled 2015-12-28: qty 4

## 2015-12-28 MED ORDER — WARFARIN SODIUM 7.5 MG PO TABS
20.0000 mg | ORAL_TABLET | Freq: Once | ORAL | Status: AC
  Administered 2015-12-28: 20 mg via ORAL
  Filled 2015-12-28: qty 1

## 2015-12-28 MED ORDER — FUROSEMIDE 20 MG PO TABS
20.0000 mg | ORAL_TABLET | Freq: Two times a day (BID) | ORAL | Status: DC
  Administered 2015-12-29: 20 mg via ORAL
  Filled 2015-12-28: qty 1

## 2015-12-28 NOTE — Consult Note (Signed)
Cardiology Consultation Note    Patient ID: Ian Foster, MRN: [id number], DOB/AGE: 1966-05-28 50 y.o. Admit date: 12/26/2015   Date of Consult: 12/28/2015 Primary Physician: No primary care provider on file. Primary Cardiologist: Dr. Cory Roughen @ [facility]  Chief Complaint: facial droop Reason for Consultation: CHF, EF 10-15% (previously 20-25%) Requesting MD: Dr. Thedore Mins  HPI: Ian Foster is a 50 y/o M with history of saddle PE @ [facility] 10/2015, 2 prior DVTs, protein C and S deficiency, NICM/chronic systolic CHF (prior EF 25% in 54/6270), mild nonobstructive CAD by cath 10/2015. DM, HTN, HLD, recent noncompliance with Coumadin was admitted 12/26/2015 with an acute MCA stroke in the setting of INR 1.21.  Ian Foster is followed at [facility] by Dr. Cory Roughen for his cardiology care. Per review of care everywhere, Ian Foster was found to have EF 25-30% at time of his saddle PE diagnosis in 10/2015 with cardiac cath 10/11/15 showing patent coronaries with mild nonobstructive CAD (20% pLAD, 20% mLAD, 30% pCx, 20% pRCA, 20% mRCA, 20% dRCA, 30% OM2). Ian Foster was seen earlier this month by his cardiologist for orthopnea and 2D Echo 12/13/15 showed LVEF mod dilated with EF 20-25%, predominantly global HK possibly worse in inferior wall, severely elevated LA pressures, RV moderately dilated with moderately reduced systolic function, severely dilated LA, moderate MR predominantly due to LV dilation and restriction of the posterior leaflet (due to the eccentricity of the MR, the severity may be underestimated), IVC dilated with decreased inspiratory collapse. Per the office note from 1/4 his Lasix was increased and f/u was scheduled. The patient reported improvement in his orthopnea with this.   Ian Foster presented with symptoms of left facial droop and drooling from the left side of the mouth. Ian Foster reported running out of Coumadin several days prior to admission - says Ian Foster was only given 30 tablets but due to his dose being extra tablets on certain days, Ian Foster  ran out early. Ian Foster say Ian Foster has to get his meds through a crisis center so obtaining refills is difficult. Ian Foster has also run out of metformin and glipizide but reports compliance with CHF meds. MRI showed right MCA distrubution infarct. Carotid duplex 12/27/15: no significant (1-39%) ICA stenosis. Neurology has recommended to r/s Coumadin without Lovenox bridge.  We are asked to see him regarding CHF. Since admission Ian Foster has been in sinus tach in the 90s-100s. Admit CXR 12/26/15 showed cardiomegaly and mild changes of congestive heart failure. Labs pertinent for LDL 158, normal CBC today. CMET grossly unremarkable except albumin 2.9. Troponin neg x1 on adm. 2D echo in our system 12/27/15 showed mod LVH, EF 10-15%, severe diffuse HK, RWMA cannot be excluded, mild-mod AI/MR, mod-severely dilated LA. Ian Foster was given 60mg  of IV Lasix today. Ian Foster has begun to urinate with a sensation of the fluid being lifted off his chest. Ian Foster reports very minimal chest pain the other day but is chest pain free now. No LEE, weight gain (222 in clinic earlier this month), syncope. O2 sat did fall to 85% with ambulation per PT notes.    Past Medical History  Diagnosis Date  . Pulmonary embolism (HCC)     a. h/o saddle PE 10/2015.  Marland Kitchen Hypertension   . Diabetes mellitus without complication (HCC)   . Chronic systolic CHF (congestive heart failure) (HCC)     a. EF 25% in 10/2015, 20-25% in 12/13/15 at [facility]. b. EF 10-15% by echo 12/27/15 at [facility].  Marland Kitchen NICM (nonischemic cardiomyopathy) (HCC)   . Mild CAD  a. cardiac cath 10/11/15 showing patent coronaries with mild nonobstructive CAD (20% pLAD, 20% mLAD, 30% pCx, 20% pRCA, 20% mRCA, 20% dRCA, 30% OM2).  . DVT (deep venous thrombosis) (HCC)   . Protein C deficiency (HCC)   . Protein S deficiency (HCC)   . CVA (cerebral infarction)     a. 12/2015: R MCA stroke in setting of subtherapeutic INR/coumadin noncompliance.  . Hyperlipidemia   . Aortic insufficiency     a. 2D echo 12/27/15: mild-mod  AI.  Marland Kitchen Mitral regurgitation     a. 2D echo 12/27/15: mild-mod MR.      Surgical History: History reviewed. No pertinent past surgical history.   Home Meds: Prior to Admission medications   Medication Sig Start Date End Date Taking? Authorizing Provider  aspirin 325 MG tablet Take 325 mg by mouth daily.   Yes Historical Provider, MD  atorvastatin (LIPITOR) 40 MG tablet Take 40 mg by mouth daily. 09/18/15  Yes Historical Provider, MD  furosemide (LASIX) 20 MG tablet Take 20 mg by mouth 2 (two) times daily.   Yes Historical Provider, MD  glipiZIDE (GLUCOTROL) 10 MG tablet Take 10 mg by mouth 2 (two) times daily. 09/18/15  Yes Historical Provider, MD  hydrochlorothiazide (HYDRODIURIL) 25 MG tablet Take 25 mg by mouth daily. 09/18/15 09/17/16 Yes Historical Provider, MD  lisinopril (PRINIVIL,ZESTRIL) 40 MG tablet Take 40 mg by mouth daily.   Yes Historical Provider, MD  metFORMIN (GLUCOPHAGE) 1000 MG tablet Take 1,000 mg by mouth 2 (two) times daily. 09/18/15  Yes Historical Provider, MD  metoprolol succinate (TOPROL-XL) 25 MG 24 hr tablet Take 25 mg by mouth daily. 12/05/15  Yes Historical Provider, MD  warfarin (COUMADIN) 5 MG tablet Take 10-15 mg by mouth See admin instructions. 10 mg on all other days except on Wednesday patient to take 15 mg 12/20/15  Yes Historical Provider, MD    Inpatient Medications:  . aspirin  324 mg Oral Daily  . atorvastatin  80 mg Oral Daily  . enoxaparin (LOVENOX) injection  40 mg Subcutaneous Q24H  . feeding supplement (ENSURE ENLIVE)  237 mL Oral BID BM  . [START ON 12/29/2015] furosemide  20 mg Oral BID  . glipiZIDE  10 mg Oral BID WC  . insulin aspart  0-5 Units Subcutaneous QHS  . insulin aspart  0-9 Units Subcutaneous TID WC  . lisinopril  40 mg Oral Daily  . metoprolol succinate  25 mg Oral Daily  . warfarin  20 mg Oral ONCE-1800  . Warfarin - Pharmacist Dosing Inpatient   Does not apply q1800      Allergies: No Known Allergies  Social History    Social History  . Marital Status: Single    Spouse Name: N/A  . Number of Children: N/A  . Years of Education: N/A   Occupational History  . Not on file.   Social History Main Topics  . Smoking status: Former Smoker    Types: Cigarettes  . Smokeless tobacco: Not on file     Comment: pt quit 6 years ago  . Alcohol Use: No  . Drug Use: No  . Sexual Activity: Not on file   Other Topics Concern  . Not on file   Social History Narrative     Family History  Problem Relation Age of Onset  . Hypertension Mother      Review of Systems: All other systems reviewed and are otherwise negative except as noted above.  Labs:  Lab Results  Component  Value Date   WBC 5.5 12/28/2015   HGB 13.7 12/28/2015   HCT 41.5 12/28/2015   MCV 97.6 12/28/2015   PLT 287 12/28/2015    Recent Labs Lab 12/27/15 0428  NA 144  K 3.7  CL 110  CO2 25  BUN 15  CREATININE 1.19  CALCIUM 8.7*  PROT 5.6*  BILITOT 0.8  ALKPHOS 92  ALT 38  AST 18  GLUCOSE 106*   Lab Results  Component Value Date   CHOL 204* 12/27/2015   HDL 28* 12/27/2015   LDLCALC 158* 12/27/2015   TRIG 92 12/27/2015   No results found for: DDIMER  Radiology/Studies:  Dg Chest 2 View  12/26/2015  CLINICAL DATA:  Shortness of breath. Bilateral flank pain. Ex-smoker. EXAM: CHEST  2 VIEW COMPARISON:  None. FINDINGS: Enlarged cardiac silhouette. Mild prominence of the pulmonary vasculature and interstitial markings with more prominent interstitial markings in the lower lung zones. Minimal bilateral pleural fluid. Unremarkable bones. IMPRESSION: Cardiomegaly and mild changes of congestive heart failure. Electronically Signed   By: Beckie Salts M.D.   On: 12/26/2015 16:01   Ct Head Wo Contrast  12/26/2015  CLINICAL DATA:  Left arm weakness EXAM: CT HEAD WITHOUT CONTRAST TECHNIQUE: Contiguous axial images were obtained from the base of the skull through the vertex without intravenous contrast. COMPARISON:  None. FINDINGS:  The bony calvarium is intact. The ventricles are of normal size and configuration. No findings to suggest acute hemorrhage, acute infarction or space-occupying mass lesion are noted. IMPRESSION: No acute abnormality noted. Electronically Signed   By: Alcide Clever M.D.   On: 12/26/2015 16:16   Mr Maxine Glenn Head Wo Contrast  12/26/2015  CLINICAL DATA:  Initial evaluation for left-sided facial droop. EXAM: MRI HEAD WITHOUT CONTRAST MRA HEAD WITHOUT CONTRAST TECHNIQUE: Multiplanar, multiecho pulse sequences of the brain and surrounding structures were obtained without intravenous contrast. Angiographic images of the head were obtained using MRA technique without contrast. COMPARISON:  Prior CT from earlier the same day. FINDINGS: MRI HEAD FINDINGS Patchy restricted diffusion involving the right frontal lobe, primarily the operculum, with involvement of the superior right insular cortex. Patchy shifted diffusion extend superiorly and medially into the deep white matter of the right corona radiata S/centrum semi ovale. No involvement of the deep gray nuclei. No associated hemorrhage or mass effect. No other areas of infarction. Major intracranial vascular flow voids are maintained. Cerebral volume within normal limits for patient age. Minimal chronic small vessel ischemic type changes within the periventricular and deep white matter. No chronic infarction. No mass lesion, midline shift or mass effect. No hydrocephalus. No extra-axial fluid collection. No acute or chronic intracranial hemorrhage. Craniocervical junction within normal limits. Pituitary gland normal. No acute abnormality about the orbits. Paranasal sinuses are clear. No mastoid effusion. Inner ear structures normal. Bone marrow signal intensity within normal limits. No scalp soft tissue abnormality. MRA HEAD FINDINGS ANTERIOR CIRCULATION: Distal cervical segments of the internal carotid arteries are widely patent with antegrade flow. Petrous, cavernous, and  supraclinoid segments widely patent. A1 segments, anterior communicating artery common anterior cerebral arteries patent without stenosis. M1 segments well opacified without stenosis or thrombosis. MCA bifurcations normal. No proximal M2 branch occlusion. Distal MCA branches symmetric and well opacified. POSTERIOR CIRCULATION: Vertebral arteries patent to the vertebrobasilar junction. Basilar artery widely patent. Superior cerebellar and posterior cerebral arteries widely patent and well opacified to their distal aspects. Small posterior communicating arteries and about laterally. No aneurysm. IMPRESSION: MRI HEAD IMPRESSION: 1. Patchy acute ischemic  multi focal right MCA territory infarcts as above. No significant mass effect or associated hemorrhage. 2. Minimal chronic small vessel ischemic disease. MRA HEAD IMPRESSION: Normal intracranial MRA. No proximal or large arterial branch occlusion. No hemodynamically significant or correctable stenosis Electronically Signed   By: Rise Mu M.D.   On: 12/26/2015 23:27   Mr Brain Wo Contrast  12/26/2015  CLINICAL DATA:  Initial evaluation for left-sided facial droop. EXAM: MRI HEAD WITHOUT CONTRAST MRA HEAD WITHOUT CONTRAST TECHNIQUE: Multiplanar, multiecho pulse sequences of the brain and surrounding structures were obtained without intravenous contrast. Angiographic images of the head were obtained using MRA technique without contrast. COMPARISON:  Prior CT from earlier the same day. FINDINGS: MRI HEAD FINDINGS Patchy restricted diffusion involving the right frontal lobe, primarily the operculum, with involvement of the superior right insular cortex. Patchy shifted diffusion extend superiorly and medially into the deep white matter of the right corona radiata S/centrum semi ovale. No involvement of the deep gray nuclei. No associated hemorrhage or mass effect. No other areas of infarction. Major intracranial vascular flow voids are maintained. Cerebral  volume within normal limits for patient age. Minimal chronic small vessel ischemic type changes within the periventricular and deep white matter. No chronic infarction. No mass lesion, midline shift or mass effect. No hydrocephalus. No extra-axial fluid collection. No acute or chronic intracranial hemorrhage. Craniocervical junction within normal limits. Pituitary gland normal. No acute abnormality about the orbits. Paranasal sinuses are clear. No mastoid effusion. Inner ear structures normal. Bone marrow signal intensity within normal limits. No scalp soft tissue abnormality. MRA HEAD FINDINGS ANTERIOR CIRCULATION: Distal cervical segments of the internal carotid arteries are widely patent with antegrade flow. Petrous, cavernous, and supraclinoid segments widely patent. A1 segments, anterior communicating artery common anterior cerebral arteries patent without stenosis. M1 segments well opacified without stenosis or thrombosis. MCA bifurcations normal. No proximal M2 branch occlusion. Distal MCA branches symmetric and well opacified. POSTERIOR CIRCULATION: Vertebral arteries patent to the vertebrobasilar junction. Basilar artery widely patent. Superior cerebellar and posterior cerebral arteries widely patent and well opacified to their distal aspects. Small posterior communicating arteries and about laterally. No aneurysm. IMPRESSION: MRI HEAD IMPRESSION: 1. Patchy acute ischemic multi focal right MCA territory infarcts as above. No significant mass effect or associated hemorrhage. 2. Minimal chronic small vessel ischemic disease. MRA HEAD IMPRESSION: Normal intracranial MRA. No proximal or large arterial branch occlusion. No hemodynamically significant or correctable stenosis Electronically Signed   By: Rise Mu M.D.   On: 12/26/2015 23:27    Wt Readings from Last 3 Encounters:  12/27/15 213 lb 6.5 oz (96.8 kg)    EKG: Sinus tach 112bpm nonspecific ST-T changes  Physical Exam: Blood pressure  120/90, pulse 94, temperature 98 F (36.7 C), temperature source Oral, resp. rate 18, height 6' (1.829 m), weight 213 lb 6.5 oz (96.8 kg), SpO2 96 %. Body mass index is 28.94 kg/(m^2). General: Well developed, well nourished AAM in no acute distress. Head: Normocephalic, atraumatic, sclera non-icteric, no xanthomas, nares are without discharge.  Neck: Negative for carotid bruits. JVD not elevated. Lungs: Clear bilaterally to auscultation without wheezes, rales, or rhonchi. Breathing is unlabored. Heart: RRR with S1 S2. No murmurs, rubs, or gallops appreciated. Abdomen: Soft, non-tender, non-distended with normoactive bowel sounds. No hepatomegaly. No rebound/guarding. No obvious abdominal masses. Msk:  Strength and tone appear normal for age. Extremities: No clubbing or cyanosis. No edema.  Distal pedal pulses are 2+ and equal bilaterally. Neuro: Alert and oriented X 3. No  facial asymmetry. No focal deficit. Moves all extremities spontaneously. Psych:  Responds to questions appropriately with a normal affect.     Assessment and Plan   1. Acute R MCA stroke in the setting of known hypercoagulable state (protein C/S deficiency) with subtherapeutic INR - per neurology. Ian Foster reports an issue getting the right amount of tablets prescribed on his prior Coumadin prescription. Will need to ensure close INR f/u at discharge with whomever Ian Foster desires to continue to follow this.  2. H/o saddle PE & DVTs 10/2015 - if sinus tach and hypoxia persist, question utility of re-scan to assess for residual or recurrent PE. On the flip side Ian Foster is not presently a candidate per neuro for Lovenox so the treatment would remain the same - compliance with anticoagulation. Will defer to IM.  3. Acute on chronic systolic CHF/NICM - agree with IV Lasix today. Ian Foster has begun to diurese well per his report. Does not appear significantly volume overloaded on exam - can reassess symptoms in AM. Continue ACEI, BB. If BP remains stable  with diuresis, can consider further titrating Toprol upward vs addition of spironolactone. Given persistence of LV dysfunction, would benefit from keeping f/u with primary cardiologist to review candidacy for ICD. Note recent LHC with only mild CAD. F/u labs in AM, along with thyroid function given tachycardia and worsening cardiomyopathy.  4. Hyperlipidemia - now on high dose statin.  5. Essential HTN - follow BP with diuresis.  6. Hypoxia - see above.  Signed, Laurann Montana PA-C 12/28/2015, 12:57 PM Pager: [phone]  Personally seen and examined. Agree with above. Stoke, acute. November 2016 saddle PE. EF 15%, NICM, difficulty obtaining meds.  Lasix helping. Laying flat now, comfortable  Perhaps tomorrow try to increase Toprol.  Donato Schultz, MD

## 2015-12-28 NOTE — Evaluation (Signed)
Occupational Therapy Evaluation Patient Details Name: Ian Foster MRN: [id number] DOB: 1966-09-01 Today's Date: 12/28/2015    History of Present Illness Pt adm with lt facial droop and found to have rt MCA CVA. PMH - DVT's, PE, protein C and S deficiency, CHF with 25% EF   Clinical Impression   Pt was independent prior to admission.  Presents with neck pain and mild decreased strength in L hand. Pt reports functional use of L UE for ADL and signature appears near baseline. He is mobilizing independently. Noted 02 desaturation in PT note, educated pt in purse lip breathing, pacing and energy conservation. No further OT needs.    Follow Up Recommendations  No OT follow up    Equipment Recommendations  None recommended by OT    Recommendations for Other Services       Precautions / Restrictions Precautions Precautions: None Restrictions Weight Bearing Restrictions: No      Mobility Bed Mobility Overal bed mobility: Independent             General bed mobility comments: HOB flat  Transfers Overall transfer level: Independent Equipment used: None                  Balance Overall balance assessment: No apparent balance deficits (not formally assessed)                                          ADL Overall ADL's : Independent                                       General ADL Comments: Issued pt a squeeze ball for strengthening, pt declining theraputty. Pt able to open containers on meal tray and sign his name legibly and near his baseline.      Vision     Perception     Praxis      Pertinent Vitals/Pain Pain Assessment: Faces Pain Score: 8  Faces Pain Scale: Hurts whole lot Pain Location: neck Pain Descriptors / Indicators: Grimacing;Guarding Pain Intervention(s): Monitored during session;Heat applied;Repositioned     Hand Dominance Left   Extremity/Trunk Assessment Upper Extremity Assessment Upper  Extremity Assessment: LUE deficits/detail LUE Deficits / Details: 4/5 gross grasp   Lower Extremity Assessment Lower Extremity Assessment: Overall WFL for tasks assessed       Communication Communication Communication: No difficulties   Cognition Arousal/Alertness: Awake/alert Behavior During Therapy: WFL for tasks assessed/performed Overall Cognitive Status: Within Functional Limits for tasks assessed                     General Comments       Exercises Exercises: Hand activities     Shoulder Instructions      Home Living Family/patient expects to be discharged to:: Private residence Living Arrangements: Spouse/significant other Available Help at Discharge: Family;Available PRN/intermittently Type of Home: Apartment Home Access: Level entry     Home Layout: Two level;Bed/bath upstairs Alternate Level Stairs-Number of Steps: 1 flight   Bathroom Shower/Tub: Chief Strategy Officer: Standard     Home Equipment: None          Prior Functioning/Environment Level of Independence: Independent             OT Diagnosis: Acute pain;Generalized weakness   OT Problem List:  OT Treatment/Interventions:      OT Goals(Current goals can be found in the care plan section)    OT Frequency:     Barriers to D/C:            Co-evaluation              End of Session    Activity Tolerance: Patient tolerated treatment well Patient left: in bed;with call bell/phone within reach   Time: 1152-1210 OT Time Calculation (min): 18 min Charges:  OT General Charges $OT Visit: 1 Procedure OT Evaluation $OT Eval Low Complexity: 1 Procedure G-Codes:    Evern Bio 12/28/2015, 12:26 PM  [phone]

## 2015-12-28 NOTE — Evaluation (Signed)
Physical Therapy Evaluation Patient Details Name: Ian Foster MRN: [id number] DOB: 17-Jun-1966 Today's Date: 12/28/2015   History of Present Illness  Pt adm with lt facial droop and found to have rt MCA CVA. PMH - DVT's, PE, protein C and S deficiency   Clinical Impression  Pt doing well with mobility and no further PT needed.  Ready for dc from PT standpoint. Pt with SpO2 of 85% on RA with incr activity. SpO2 100% with activity on 2L.      Follow Up Recommendations No PT follow up    Equipment Recommendations  Other (comment) (O2)    Recommendations for Other Services       Precautions / Restrictions Precautions Precautions: None Restrictions Weight Bearing Restrictions: No      Mobility  Bed Mobility Overal bed mobility: Independent (per pt)                Transfers Overall transfer level: Independent Equipment used: None                Ambulation/Gait Ambulation/Gait assistance: Independent Ambulation Distance (Feet): 500 Feet Assistive device: None Gait Pattern/deviations: WFL(Within Functional Limits) Gait velocity: decr Gait velocity interpretation: Below normal speed for age/gender General Gait Details: Steady gait. Pt reports he doesn't feel as steady as normal  Stairs Stairs: Yes Stairs assistance: Modified independent (Device/Increase time) Stair Management: One rail Right;Alternating pattern;Forwards Number of Stairs: 12 General stair comments: Dyspnea 2/4. SpO2 85% on RA  Wheelchair Mobility    Modified Rankin (Stroke Patients Only) Modified Rankin (Stroke Patients Only) Pre-Morbid Rankin Score: No symptoms Modified Rankin: No significant disability     Balance Overall balance assessment: No apparent balance deficits (not formally assessed)                                           Pertinent Vitals/Pain Pain Assessment: 0-10 Pain Score: 8  Pain Location: neck Pain Descriptors / Indicators: Jabbing Pain  Intervention(s): Limited activity within patient's tolerance;Heat applied    Home Living Family/patient expects to be discharged to:: Private residence Living Arrangements: Spouse/significant other Available Help at Discharge: Family;Available PRN/intermittently Type of Home: Apartment Home Access: Level entry     Home Layout: Two level;Bed/bath upstairs Home Equipment: None      Prior Function Level of Independence: Independent               Hand Dominance   Dominant Hand: Left    Extremity/Trunk Assessment   Upper Extremity Assessment: Defer to OT evaluation           Lower Extremity Assessment: Overall WFL for tasks assessed         Communication   Communication: No difficulties  Cognition Arousal/Alertness: Awake/alert Behavior During Therapy: WFL for tasks assessed/performed Overall Cognitive Status: Within Functional Limits for tasks assessed                      General Comments      Exercises        Assessment/Plan    PT Assessment Patent does not need any further PT services  PT Diagnosis Difficulty walking   PT Problem List    PT Treatment Interventions     PT Goals (Current goals can be found in the Care Plan section) Acute Rehab PT Goals PT Goal Formulation: All assessment and education complete, DC therapy  Frequency     Barriers to discharge        Co-evaluation               End of Session Equipment Utilized During Treatment: Oxygen Activity Tolerance: Patient tolerated treatment well Patient left: in chair;with call bell/phone within reach Nurse Communication: Mobility status (SpO2)         Time: 1610-9604 PT Time Calculation (min) (ACUTE ONLY): 21 min   Charges:   PT Evaluation $PT Eval Moderate Complexity: 1 Procedure     PT G Codes:        Ian Foster 19-Jan-2016, 10:02 AM Ian Foster PT [phone]

## 2015-12-28 NOTE — Progress Notes (Signed)
ANTICOAGULATION CONSULT NOTE  Pharmacy Consult for warfarin Indication: pulmonary embolus  No Known Allergies  Patient Measurements: Height: 6' (182.9 cm) Weight: 213 lb 6.5 oz (96.8 kg) IBW/kg (Calculated) : 77.6  Vital Signs: Temp: 98 F (36.7 C) (01/27 0518) Temp Source: Oral (01/27 0518) BP: 120/90 mmHg (01/27 0518) Pulse Rate: 46 (01/27 0518)  Labs:  Recent Labs  12/26/15 1545 12/27/15 0428 12/28/15 0553  HGB 13.9 12.5* 13.7  HCT 43.0 39.2 41.5  PLT 321 296 287  APTT 29  --   --   LABPROT 15.5*  --  16.2*  INR 1.21  --  1.29  CREATININE 1.32* 1.19  --     Estimated Creatinine Clearance: 89.6 mL/min (by C-G formula based on Cr of 1.19).  Assessment: 83 YOM admitted with stroke symptoms. Was diagnosed with saddle PE in November 2016 at [facility] and was on warfarin PTA for protein C&S deficiency. Per notes, he ran out of warfarin and was not taking it for a few days prior to coming in. INR 1.29, Hgb 13.7, plts 287. Home dose is 10mg  daily except 15mg  on Wednesdays.  Per stroke note, no bridging with Lovenox, to continue aspirin until INR is therapeutic. Currently on Lovenox 40mg  subQ q24h (prophylactic dosing), and ASA 324mg  PO daily.   Goal of Therapy:  INR 2-3 Monitor platelets by anticoagulation protocol: Yes   Plan:  -warfarin 20mg  po x1 tonight -daily INR -follow CBC and s/s bleeding -continue Lovenox and ASA until INR therapeutic or until changes made by MD  Leotis Shames D. Filemon Breton, PharmD, BCPS Clinical Pharmacist Pager: [phone] 12/28/2015 9:02 AM

## 2015-12-28 NOTE — Progress Notes (Signed)
SATURATION QUALIFICATIONS: (This note is used to comply with regulatory documentation for home oxygen)  Patient Saturations on Room Air at Rest = 94%  Patient Saturations on Room Air while Ambulating = 85%  Patient Saturations on 2 Liters of oxygen while Ambulating = 100%  Please briefly explain why patient needs home oxygen:Decr level of SpO2 with activity without supplemental O2. [employer] PT [phone]

## 2015-12-28 NOTE — Progress Notes (Signed)
PCCM Interval Note  Notes reviewed.  Dr Pearlean Brownie has evaluated and indicates that it is OK for pt to restart coumadin (without enoxaparin bridge) now. I will defer to him with regard to any associated bleeding risk of hemorrhagic conversion. Nothing else for [place] to add at this time. Please call if we can assist further.   Levy Pupa, MD, PhD 12/28/2015, 9:58 AM [facility] [phone] or if no answer [phone]

## 2015-12-28 NOTE — Progress Notes (Signed)
STROKE TEAM PROGRESS NOTE   HISTORY OF PRESENT ILLNESS Ian Foster is an 50 y.o. male hx of HTN, DM, PE (on coumadin) presenting with sudden onset of left-sided facial droop. Reports waking up in the morning and noted that he could not control drool coming out of the left side of his mouth. MRI brain imaging reviewed, shows right MCA distribution infarct. (LKW 12/25/2015, time unknown). 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 He ran out a few days ago and has not been taking it. INR 1.21 in the ED. Per review of [facility] notes he was diagnosed with a large saddle PE in November 2016. Modified Rankin: Rankin Score=0. Patient was not administered TPA secondary to outside tPA window. He was admitted for further evaluation and treatment.   SUBJECTIVE (INTERVAL HISTORY) His family is not at the bedside.   Overall he feels his condition is stable. He was counselled to be complaint with meds and f/u.   OBJECTIVE Temp:  [97.8 F (36.6 C)-98.1 F (36.7 C)] 97.8 F (36.6 C) (01/27 1422) Pulse Rate:  [46-95] 95 (01/27 1422) Cardiac Rhythm:  [-] Sinus tachycardia (01/26 1900) Resp:  [18-19] 18 (01/27 1422) BP: (104-120)/(66-90) 104/66 mmHg (01/27 1422) SpO2:  [92 %-96 %] 92 % (01/27 1422)  CBC:   Recent Labs Lab 12/26/15 1545 12/27/15 0428 12/28/15 0553  WBC 5.5 6.4 5.5  NEUTROABS 3.1  --   --   HGB 13.9 12.5* 13.7  HCT 43.0 39.2 41.5  MCV 98.4 98.0 97.6  PLT 321 296 287    Basic Metabolic Panel:   Recent Labs Lab 12/26/15 1545 12/27/15 0428  NA 143 144  K 4.0 3.7  CL 108 110  CO2 24 25  GLUCOSE 150* 106*  BUN 14 15  CREATININE 1.32* 1.19  CALCIUM 9.2 8.7*    Lipid Panel:     Component Value Date/Time   CHOL 204* 12/27/2015 0428   TRIG 92 12/27/2015 0428   HDL 28* 12/27/2015 0428   CHOLHDL 7.3 12/27/2015 0428   VLDL 18 12/27/2015 0428   LDLCALC 158*  12/27/2015 0428   HgbA1c:  Lab Results  Component Value Date   HGBA1C 6.0* 12/27/2015   Urine Drug Screen: No results found for: LABOPIA, COCAINSCRNUR, LABBENZ, AMPHETMU, THCU, LABBARB    IMAGING  Dg Chest 2 View 12/26/2015  Cardiomegaly and mild changes of congestive heart failure.   Ct Head Wo Contrast 12/26/2015   No acute abnormality noted.   MRI HEAD  12/26/2015   1. Patchy acute ischemic multi focal right MCA territory infarcts as above. No significant mass effect or associated hemorrhage. 2. Minimal chronic small vessel ischemic disease.   MRA HEAD  12/26/2015   Normal intracranial MRA. No proximal or large arterial branch occlusion. No hemodynamically significant or correctable stenosis    PHYSICAL EXAM Middle-aged African-American male currently not in distress. . Afebrile. Head is nontraumatic. Neck is supple without bruit.    Cardiac exam no murmur or gallop. Lungs are clear to auscultation. Distal pulses are well felt. Neurological Exam :  Awake alert oriented x 3 normal speech and language. Pupils are equal reactive. Extraocular movements are full range without nystagmus. Vision acuity is adequate. Visual fields are full to bedside confrontational testing. Fundi were not visualized. Mild left lower face asymmetry. Tongue midline. No drift. Mild diminished fine finger movements on left. Orbits right  over left upper extremity. Mild left grip weak.. Normal sensation . Normal coordination. ASSESSMENT/PLAN Mr. Ian Foster is a 50 y.o. male with history of HTN, DM, Prot C&S deficiency with PE (on coumadin)  presenting with facial droop. He did not receive IV t-PA due to delay in arrival.   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 Antegrade vertebral flow. 2D Echo  Left ventricle:  The cavity size was mildly dilated. Wall thickness was increased in a pattern of moderate LVH. Systolic function was normal. The estimated ejection fraction was in the range of 10% to 15%. Severe diffuse hypokinesis  LDL 158  HgbA1c pending  Lovenox 40 mg sq daily for VTE prophylaxis Diet Carb Modified Fluid consistency:: Thin; Room service appropriate?: Yes; Fluid restriction:: 1200 mL Fluid  aspirin 325 mg daily and warfarin daily prior to admission but had ran out of warfarin, not taking for a few days prior to admission, INR on admission 1.21   now on aspirin 325 mg daily. Warfarin currently on hold   Ok to resume warfarin from the stroke standpoint. Recommend aspirin 81 mg daily until warfarin therapeutic. Do not recommend lovenox bridge.  Patient counseled to be compliant with his antithrombotic medications because he has been told by other MDs this admission to hold warfarin x 2 weeks. It is ok to resume now.  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 I have made any additions or clarifications directly to the above note. Agree with note above.  He presented with left hemiplegia secondary to right MCA branch infarct likely related to  hypercoagulability from his protein S and C deficiency and being off anticoagulation. He remains at risk for neurological worsening, recurrent stroke, TIA, DVT, pulmonary embolism and needs ongoing stroke evaluation and anticoagulation long-term. Recommend start warfarin and continue aspirin until INR is optimal then discontinue aspirin. No need for bridging with Lovenox due to increased risk of hemorrhagic transformation. [name] Heady, MD Medical Director [facility] Pager: [phone] 12/28/2015 4:56 PM    To contact Stroke Continuity provider, please refer to [email or web address]. After hours, contact General Neurology

## 2015-12-29 DIAGNOSIS — R0902 Hypoxemia: Secondary | ICD-10-CM

## 2015-12-29 LAB — CBC
HEMATOCRIT: 39.7 % (ref 39.0–52.0)
Hemoglobin: 13.2 g/dL (ref 13.0–17.0)
MCH: 32.4 pg (ref 26.0–34.0)
MCHC: 33.2 g/dL (ref 30.0–36.0)
MCV: 97.5 fL (ref 78.0–100.0)
PLATELETS: 285 10*3/uL (ref 150–400)
RBC: 4.07 MIL/uL — ABNORMAL LOW (ref 4.22–5.81)
RDW: 13.5 % (ref 11.5–15.5)
WBC: 5.6 10*3/uL (ref 4.0–10.5)

## 2015-12-29 LAB — BASIC METABOLIC PANEL
ANION GAP: 9 (ref 5–15)
BUN: 16 mg/dL (ref 6–20)
CHLORIDE: 111 mmol/L (ref 101–111)
CO2: 24 mmol/L (ref 22–32)
Calcium: 8.5 mg/dL — ABNORMAL LOW (ref 8.9–10.3)
Creatinine, Ser: 1.22 mg/dL (ref 0.61–1.24)
Glucose, Bld: 93 mg/dL (ref 65–99)
POTASSIUM: 3.6 mmol/L (ref 3.5–5.1)
SODIUM: 144 mmol/L (ref 135–145)

## 2015-12-29 LAB — GLUCOSE, CAPILLARY
GLUCOSE-CAPILLARY: 94 mg/dL (ref 65–99)
GLUCOSE-CAPILLARY: 167 mg/dL — AB (ref 65–99)
Glucose-Capillary: 121 mg/dL — ABNORMAL HIGH (ref 65–99)
Glucose-Capillary: 80 mg/dL (ref 65–99)

## 2015-12-29 LAB — BRAIN NATRIURETIC PEPTIDE: B NATRIURETIC PEPTIDE 5: 821.6 pg/mL — AB (ref 0.0–100.0)

## 2015-12-29 LAB — PROTIME-INR
INR: 1.89 — AB (ref 0.00–1.49)
Prothrombin Time: 21.6 seconds — ABNORMAL HIGH (ref 11.6–15.2)

## 2015-12-29 MED ORDER — FUROSEMIDE 10 MG/ML IJ SOLN
40.0000 mg | Freq: Two times a day (BID) | INTRAMUSCULAR | Status: DC
  Administered 2015-12-29 – 2015-12-30 (×2): 40 mg via INTRAVENOUS
  Filled 2015-12-29 (×2): qty 4

## 2015-12-29 MED ORDER — POTASSIUM CHLORIDE ER 10 MEQ PO TBCR
20.0000 meq | EXTENDED_RELEASE_TABLET | Freq: Two times a day (BID) | ORAL | Status: DC
  Administered 2015-12-29 – 2015-12-30 (×3): 20 meq via ORAL
  Filled 2015-12-29 (×7): qty 2

## 2015-12-29 MED ORDER — WARFARIN SODIUM 5 MG PO TABS
10.0000 mg | ORAL_TABLET | Freq: Once | ORAL | Status: AC
  Administered 2015-12-29: 10 mg via ORAL
  Filled 2015-12-29: qty 2

## 2015-12-29 NOTE — Progress Notes (Signed)
SATURATION QUALIFICATIONS: (This note is used to comply with regulatory documentation for home oxygen)  Patient Saturations on Room Air at Rest = 98%  Patient Saturations on Room Air while Ambulating = 93%  Please briefly explain why patient needs home oxygen: does not qualify 

## 2015-12-29 NOTE — Progress Notes (Signed)
Patient Demographics:    Ian Foster, is a 50 y.o. male, DOB - 05-May-1966, GNF:[id number]  Admit date - 12/26/2015   Admitting Physician Eduard Clos, MD  Outpatient Primary MD for the patient is No primary care provider on file.  LOS - 2   Chief Complaint  Patient presents with  . Shortness of Breath  . Facial Droop  . Aphasia        Subjective:    Ian Foster today has, No headache, No chest pain, No abdominal pain - No Nausea, No new weakness tingling or numbness, No Cough - SOB.     Assessment  & Plan :     1. 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 Full stroke workup being done, neurology on board, currently on aspirin full dose per neurology, he was also on anticoagulation with Coumadin for a recent PE, INR subtherapeutic and he has been placed on Coumadin with pharmacy monitoring. LDL was over 70 and I have doubled his Lipitor dose, A1c is 6, further recommendations per neurology. Stop ASA once INR 2 per Neuro. D/W Dr Pearlean Brownie.  2. Recent history of PE with hypercoagulable state. Being followed at [facility]. Coumadin with subtherapeutic INR, INR better pharmacy monitoring, once INR 2 will be discharged home.  3. Dyslipidemia. LDL not at goal Lipitor doubled.  4. Noncompliance with medications. Counseled, he wants to follow at Coumadin clinic locally, we'll set him up with local Coumadin clinic in [place] instead of [facility] upon discharge.  5. Mild acute on Chronic systolic heart failure with EF 25%. On ACE inhibitor continue resume, given 1 dose of IV Lasix now back to home dose. Close to compensated. Seen by cardiology.  6. DM type II. A1c pending, continue present regimen of sliding scale and glipizide.  Lab Results  Component Value Date   HGBA1C 6.0* 12/27/2015      CBG (last 3)   Recent Labs  12/28/15 2011 12/28/15 2156 12/29/15 0758  GLUCAP 201* 95 94      Code Status : Full  Family Communication  : Wife  Disposition Plan  : Likely home tomorrow if INR 2  Consults  :  Neurology, Cards  Procedures  :   CT Foster, MRI/MRA brain. Confirming right MCA infarct  Echogram  Left ventricle: The cavity size was mildly dilated. Wallthickness was increased in a pattern of moderate LVH. Systolicfunction was normal. The estimated ejection fraction was in therange of 10% to 15%. Severe diffuse hypokinesis. Regional wall motion abnormalities cannot be excluded. - Aortic valve: There was mild to moderate regurgitation. - Mitral valve: There was mild to moderate regurgitation. - Left atrium: The atrium was moderately to severely dilated.  Carotid duplex  Carotid Duplex (Doppler) has been completed. Preliminary findings: Bilateral: No significant (1-39%) ICA stenosis. Antegrade vertebral flow.   Lower extremity venous duplex  DVT Prophylaxis  :  Lovenox    Lab Results  Component Value Date   PLT 285 12/29/2015    Inpatient Medications  Scheduled Meds: . aspirin  324 mg Oral Daily  . atorvastatin  80 mg Oral Daily  . enoxaparin (LOVENOX) injection  40 mg Subcutaneous Q24H  . feeding supplement (ENSURE ENLIVE)  237 mL Oral BID BM  .  furosemide  20 mg Oral BID  . glipiZIDE  10 mg Oral BID WC  . insulin aspart  0-5 Units Subcutaneous QHS  . insulin aspart  0-9 Units Subcutaneous TID WC  . lisinopril  40 mg Oral Daily  . metoprolol succinate  25 mg Oral Daily  . Warfarin - Pharmacist Dosing Inpatient   Does not apply q1800   Continuous Infusions:  PRN Meds:.acetaminophen, senna-docusate  Antibiotics  :    Anti-infectives    None        Objective:   Filed Vitals:   12/28/15 2010 12/28/15 2158 12/29/15 0207 12/29/15 0502  BP: 122/77 121/87 115/67 99/70  Pulse: 220 98  95  Temp: 98.7 F (37.1 C)  98.9 F (37.2 C) 97.9  F (36.6 C)  TempSrc: Oral  Oral Oral  Resp: 18  18 20   Height:      Weight:      SpO2: 97% 99%  95%    Wt Readings from Last 3 Encounters:  12/27/15 96.8 kg (213 lb 6.5 oz)     Intake/Output Summary (Last 24 hours) at 12/29/15 0848 Last data filed at 12/28/15 1422  Gross per 24 hour  Intake    422 ml  Output    800 ml  Net   -378 ml     Physical Exam  Awake Alert, Oriented X 3, No new F.N deficits, Normal affect [place].AT,PERRAL, R. facial droop Supple Neck,No JVD, No cervical lymphadenopathy appriciated.  Symmetrical Chest wall movement, Good air movement bilaterally, CTAB RRR,No Gallops,Rubs or new Murmurs, No Parasternal Heave +ve B.Sounds, Abd Soft, No tenderness, No organomegaly appriciated, No rebound - guarding or rigidity. No Cyanosis, Clubbing or edema, No new Rash or bruise       Data Review:   Micro Results No results found for this or any previous visit (from the past 240 hour(s)).  Radiology Reports Dg Chest 2 View  12/26/2015  CLINICAL DATA:  Shortness of breath. Bilateral flank pain. Ex-smoker. EXAM: CHEST  2 VIEW COMPARISON:  None. FINDINGS: Enlarged cardiac silhouette. Mild prominence of the pulmonary vasculature and interstitial markings with more prominent interstitial markings in the lower lung zones. Minimal bilateral pleural fluid. Unremarkable bones. IMPRESSION: Cardiomegaly and mild changes of congestive heart failure. Electronically Signed   By: Beckie Salts M.D.   On: 12/26/2015 16:01   Ct Foster Wo Contrast  12/26/2015  CLINICAL DATA:  Left arm weakness EXAM: CT Foster WITHOUT CONTRAST TECHNIQUE: Contiguous axial images were obtained from the base of the skull through the vertex without intravenous contrast. COMPARISON:  None. FINDINGS: The bony calvarium is intact. The ventricles are of normal size and configuration. No findings to suggest acute hemorrhage, acute infarction or space-occupying mass lesion are noted. IMPRESSION: No acute abnormality  noted. Electronically Signed   By: Alcide Clever M.D.   On: 12/26/2015 16:16   Mr Ian Foster Wo Contrast  12/26/2015  CLINICAL DATA:  Initial evaluation for left-sided facial droop. EXAM: MRI Foster WITHOUT CONTRAST MRA Foster WITHOUT CONTRAST TECHNIQUE: Multiplanar, multiecho pulse sequences of the brain and surrounding structures were obtained without intravenous contrast. Angiographic images of the Foster were obtained using MRA technique without contrast. COMPARISON:  Prior CT from earlier the same day. FINDINGS: MRI Foster FINDINGS Patchy restricted diffusion involving the right frontal lobe, primarily the operculum, with involvement of the superior right insular cortex. Patchy shifted diffusion extend superiorly and medially into the deep white matter of the right corona radiata S/centrum semi ovale. No  involvement of the deep gray nuclei. No associated hemorrhage or mass effect. No other areas of infarction. Major intracranial vascular flow voids are maintained. Cerebral volume within normal limits for patient age. Minimal chronic small vessel ischemic type changes within the periventricular and deep white matter. No chronic infarction. No mass lesion, midline shift or mass effect. No hydrocephalus. No extra-axial fluid collection. No acute or chronic intracranial hemorrhage. Craniocervical junction within normal limits. Pituitary gland normal. No acute abnormality about the orbits. Paranasal sinuses are clear. No mastoid effusion. Inner ear structures normal. Bone marrow signal intensity within normal limits. No scalp soft tissue abnormality. MRA Foster FINDINGS ANTERIOR CIRCULATION: Distal cervical segments of the internal carotid arteries are widely patent with antegrade flow. Petrous, cavernous, and supraclinoid segments widely patent. A1 segments, anterior communicating artery common anterior cerebral arteries patent without stenosis. M1 segments well opacified without stenosis or thrombosis. MCA bifurcations  normal. No proximal M2 branch occlusion. Distal MCA branches symmetric and well opacified. POSTERIOR CIRCULATION: Vertebral arteries patent to the vertebrobasilar junction. Basilar artery widely patent. Superior cerebellar and posterior cerebral arteries widely patent and well opacified to their distal aspects. Small posterior communicating arteries and about laterally. No aneurysm. IMPRESSION: MRI Foster IMPRESSION: 1. Patchy acute ischemic multi focal right MCA territory infarcts as above. No significant mass effect or associated hemorrhage. 2. Minimal chronic small vessel ischemic disease. MRA Foster IMPRESSION: Normal intracranial MRA. No proximal or large arterial branch occlusion. No hemodynamically significant or correctable stenosis Electronically Signed   By: Rise Mu M.D.   On: 12/26/2015 23:27   Mr Brain Wo Contrast  12/26/2015  CLINICAL DATA:  Initial evaluation for left-sided facial droop. EXAM: MRI Foster WITHOUT CONTRAST MRA Foster WITHOUT CONTRAST TECHNIQUE: Multiplanar, multiecho pulse sequences of the brain and surrounding structures were obtained without intravenous contrast. Angiographic images of the Foster were obtained using MRA technique without contrast. COMPARISON:  Prior CT from earlier the same day. FINDINGS: MRI Foster FINDINGS Patchy restricted diffusion involving the right frontal lobe, primarily the operculum, with involvement of the superior right insular cortex. Patchy shifted diffusion extend superiorly and medially into the deep white matter of the right corona radiata S/centrum semi ovale. No involvement of the deep gray nuclei. No associated hemorrhage or mass effect. No other areas of infarction. Major intracranial vascular flow voids are maintained. Cerebral volume within normal limits for patient age. Minimal chronic small vessel ischemic type changes within the periventricular and deep white matter. No chronic infarction. No mass lesion, midline shift or mass effect. No  hydrocephalus. No extra-axial fluid collection. No acute or chronic intracranial hemorrhage. Craniocervical junction within normal limits. Pituitary gland normal. No acute abnormality about the orbits. Paranasal sinuses are clear. No mastoid effusion. Inner ear structures normal. Bone marrow signal intensity within normal limits. No scalp soft tissue abnormality. MRA Foster FINDINGS ANTERIOR CIRCULATION: Distal cervical segments of the internal carotid arteries are widely patent with antegrade flow. Petrous, cavernous, and supraclinoid segments widely patent. A1 segments, anterior communicating artery common anterior cerebral arteries patent without stenosis. M1 segments well opacified without stenosis or thrombosis. MCA bifurcations normal. No proximal M2 branch occlusion. Distal MCA branches symmetric and well opacified. POSTERIOR CIRCULATION: Vertebral arteries patent to the vertebrobasilar junction. Basilar artery widely patent. Superior cerebellar and posterior cerebral arteries widely patent and well opacified to their distal aspects. Small posterior communicating arteries and about laterally. No aneurysm. IMPRESSION: MRI Foster IMPRESSION: 1. Patchy acute ischemic multi focal right MCA territory infarcts as above. No significant mass  effect or associated hemorrhage. 2. Minimal chronic small vessel ischemic disease. MRA Foster IMPRESSION: Normal intracranial MRA. No proximal or large arterial branch occlusion. No hemodynamically significant or correctable stenosis Electronically Signed   By: Rise Mu M.D.   On: 12/26/2015 23:27     CBC  Recent Labs Lab 12/26/15 1545 12/27/15 0428 12/28/15 0553 12/29/15 0535  WBC 5.5 6.4 5.5 5.6  HGB 13.9 12.5* 13.7 13.2  HCT 43.0 39.2 41.5 39.7  PLT 321 296 287 285  MCV 98.4 98.0 97.6 97.5  MCH 31.8 31.3 32.2 32.4  MCHC 32.3 31.9 33.0 33.2  RDW 13.5 13.5 13.5 13.5  LYMPHSABS 2.1  --   --   --   MONOABS 0.2  --   --   --   EOSABS 0.1  --   --   --     BASOSABS 0.0  --   --   --     Chemistries   Recent Labs Lab 12/26/15 1545 12/27/15 0428 12/29/15 0535  NA 143 144 144  K 4.0 3.7 3.6  CL 108 110 111  CO2 24 25 24   GLUCOSE 150* 106* 93  BUN 14 15 16   CREATININE 1.32* 1.19 1.22  CALCIUM 9.2 8.7* 8.5*  AST 22 18  --   ALT 45 38  --   ALKPHOS 109 92  --   BILITOT 1.1 0.8  --    ------------------------------------------------------------------------------------------------------------------  Recent Labs  12/27/15 0428  CHOL 204*  HDL 28*  LDLCALC 158*  TRIG 92  CHOLHDL 7.3    Lab Results  Component Value Date   HGBA1C 6.0* 12/27/2015   ------------------------------------------------------------------------------------------------------------------  Recent Labs  12/28/15 1600  TSH 0.316*   ------------------------------------------------------------------------------------------------------------------ No results for input(s): VITAMINB12, FOLATE, FERRITIN, TIBC, IRON, RETICCTPCT in the last 72 hours.  Coagulation profile  Recent Labs Lab 12/26/15 1545 12/28/15 0553 12/29/15 0535  INR 1.21 1.29 1.89*    No results for input(s): DDIMER in the last 72 hours.  Cardiac Enzymes No results for input(s): CKMB, TROPONINI, MYOGLOBIN in the last 168 hours.  Invalid input(s): CK ------------------------------------------------------------------------------------------------------------------    Component Value Date/Time   BNP 821.6* 12/29/2015 0533    Time Spent in minutes  35   Jaquanda Wickersham K M.D on 12/29/2015 at 8:48 AM  Between 7am to 7pm - Pager - [phone]  After 7pm go to [email or web address] - password [email or web address]  [facility] -  Office  [phone]

## 2015-12-29 NOTE — Progress Notes (Addendum)
Subjective:   Says he is peeing well No weight on chart. I/Os inaccurate.   Still with DOE and orthopnea     Intake/Output Summary (Last 24 hours) at 12/29/15 1348 Last data filed at 12/29/15 0932  Gross per 24 hour  Intake    240 ml  Output    400 ml  Net   -160 ml    Current meds: . aspirin  324 mg Oral Daily  . atorvastatin  80 mg Oral Daily  . enoxaparin (LOVENOX) injection  40 mg Subcutaneous Q24H  . feeding supplement (ENSURE ENLIVE)  237 mL Oral BID BM  . furosemide  20 mg Oral BID  . glipiZIDE  10 mg Oral BID WC  . insulin aspart  0-5 Units Subcutaneous QHS  . insulin aspart  0-9 Units Subcutaneous TID WC  . lisinopril  40 mg Oral Daily  . metoprolol succinate  25 mg Oral Daily  . warfarin  10 mg Oral ONCE-1800  . Warfarin - Pharmacist Dosing Inpatient   Does not apply q1800   Infusions:     Objective:  Blood pressure 107/88, pulse 96, temperature 98.7 F (37.1 C), temperature source Oral, resp. rate 22, height 6' (1.829 m), weight 96.8 kg (213 lb 6.5 oz), SpO2 97 %. Weight change:   Physical Exam: General: Well developed, well nourished AAM in no acute distress. Head: Normocephalic, atraumatic, sclera non-icteric, no xanthomas, nares are without discharge. Neck: Negative for carotid bruits. JVP to jaw Lungs: Clear bilaterally to auscultation without wheezes, rales, or rhonchi. Breathing is unlabored. Heart: PMI laterally displaced regular with S1 S2. Tachy + s3 Abdomen: Soft, non-tender, non-distended with normoactive bowel sounds. No hepatomegaly. No rebound/guarding. No obvious abdominal masses. Msk: Strength and tone appear normal for age. Extremities: No clubbing or cyanosis. No edema. Distal pedal pulses are 2+ and equal bilaterally. Neuro: Alert and oriented X 3. Left facial droop  Moves all extremities spontaneously. Mildly weak in L  Psych: Responds to questions   Telemetry: NSR 90-100  Lab Results: Basic Metabolic Panel:  Recent  Labs Lab 12/26/15 1545 12/27/15 0428 12/29/15 0535  NA 143 144 144  K 4.0 3.7 3.6  CL 108 110 111  CO2 24 25 24   GLUCOSE 150* 106* 93  BUN 14 15 16   CREATININE 1.32* 1.19 1.22  CALCIUM 9.2 8.7* 8.5*   Liver Function Tests:  Recent Labs Lab 12/26/15 1545 12/27/15 0428  AST 22 18  ALT 45 38  ALKPHOS 109 92  BILITOT 1.1 0.8  PROT 6.7 5.6*  ALBUMIN 3.7 2.9*   No results for input(s): LIPASE, AMYLASE in the last 168 hours. No results for input(s): AMMONIA in the last 168 hours. CBC:  Recent Labs Lab 12/26/15 1545 12/27/15 0428 12/28/15 0553 12/29/15 0535  WBC 5.5 6.4 5.5 5.6  NEUTROABS 3.1  --   --   --   HGB 13.9 12.5* 13.7 13.2  HCT 43.0 39.2 41.5 39.7  MCV 98.4 98.0 97.6 97.5  PLT 321 296 287 285   Cardiac Enzymes: No results for input(s): CKTOTAL, CKMB, CKMBINDEX, TROPONINI in the last 168 hours. BNP: Invalid input(s): POCBNP CBG:  Recent Labs Lab 12/28/15 1707 12/28/15 2011 12/28/15 2156 12/29/15 0758 12/29/15 1208  GLUCAP 82 201* 95 94 121*   Microbiology: No results found for: CULT No results for input(s): CULT, SDES in the last 168 hours.  Imaging: No results found.   ASSESSMENT/PLAN:   1. Acute on chronic systolic CHF/NICM - EF 25%. Recent  LHC with only mild CAD. His HF remains tenuous. NYHA IIIB. Neck veins up. + s3 on exam. Will start IV lasix. Add digoxin. Continue ACE and low-dose b-blocker. No spiro with non-compliance. Needs daily weights and I/Os. Discussed with nursing staff. Eventually will need to switch to [facility].    2. Acute R MCA stroke in the setting of known hypercoagulable state (protein C/S deficiency) with subtherapeutic INR - per neurology. He reports an issue getting the right amount of tablets prescribed on his prior Coumadin prescription. Will need to ensure close INR f/u at discharge with whomever he desires to continue to follow this. Would it be better to consider a DOAC?   3. 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 Continue anti-coagulation   4. Hyperlipidemia - now on high dose statin.  5. Essential HTN - follow BP with diuresis.  6. Hypoxia - see above. May need home O2    LOS: 2 days    Arvilla Meres, MD 12/29/2015, 1:48 PM

## 2015-12-29 NOTE — Progress Notes (Signed)
ANTICOAGULATION CONSULT NOTE  Pharmacy Consult for warfarin Indication: pulmonary embolus  No Known Allergies  Patient Measurements: Height: 6' (182.9 cm) Weight: 213 lb 6.5 oz (96.8 kg) IBW/kg (Calculated) : 77.6  Vital Signs: Temp: 98.7 F (37.1 C) (01/28 1006) Temp Source: Oral (01/28 0502) BP: 90/59 mmHg (01/28 1006) Pulse Rate: 98 (01/28 1006)  Labs:  Recent Labs  12/26/15 1545 12/27/15 0428 12/28/15 0553 12/29/15 0535  HGB 13.9 12.5* 13.7 13.2  HCT 43.0 39.2 41.5 39.7  PLT 321 296 287 285  APTT 29  --   --   --   LABPROT 15.5*  --  16.2* 21.6*  INR 1.21  --  1.29 1.89*  CREATININE 1.32* 1.19  --  1.22    Estimated Creatinine Clearance: 87.4 mL/min (by C-G formula based on Cr of 1.22).  Assessment: 55 YOM admitted with stroke symptoms. Was diagnosed with saddle PE in November 2016 at [facility] and was on warfarin PTA for protein C&S deficiency. Per notes, he ran out of warfarin and was not taking it for a few days prior to coming in. INR up to 1.89 today after loading dose yesterday.  Home dose is 10mg  daily except 15mg  on Wednesdays.  Per stroke note, no bridging with Lovenox, to continue aspirin until INR is therapeutic. Currently on Lovenox 40mg  subQ q24h (prophylactic dosing), and ASA 324mg  PO daily.   Goal of Therapy:  INR 2-3 Monitor platelets by anticoagulation protocol: Yes   Plan:   Coumadin 10mg  PO x1 daily INR continue Lovenox and ASA until INR therapeutic or until changes made by MD  Ulyses Southward, PharmD Pager: [phone] 12/29/2015 10:36 AM

## 2015-12-30 LAB — CBC
HCT: 40.9 % (ref 39.0–52.0)
HEMOGLOBIN: 13.1 g/dL (ref 13.0–17.0)
MCH: 31.3 pg (ref 26.0–34.0)
MCHC: 32 g/dL (ref 30.0–36.0)
MCV: 97.8 fL (ref 78.0–100.0)
PLATELETS: 296 10*3/uL (ref 150–400)
RBC: 4.18 MIL/uL — AB (ref 4.22–5.81)
RDW: 13.4 % (ref 11.5–15.5)
WBC: 5.8 10*3/uL (ref 4.0–10.5)

## 2015-12-30 LAB — BASIC METABOLIC PANEL
Anion gap: 7 (ref 5–15)
BUN: 16 mg/dL (ref 6–20)
CHLORIDE: 109 mmol/L (ref 101–111)
CO2: 27 mmol/L (ref 22–32)
Calcium: 8.7 mg/dL — ABNORMAL LOW (ref 8.9–10.3)
Creatinine, Ser: 1.16 mg/dL (ref 0.61–1.24)
Glucose, Bld: 98 mg/dL (ref 65–99)
POTASSIUM: 3.9 mmol/L (ref 3.5–5.1)
SODIUM: 143 mmol/L (ref 135–145)

## 2015-12-30 LAB — PROTIME-INR
INR: 1.96 — AB (ref 0.00–1.49)
PROTHROMBIN TIME: 22.2 s — AB (ref 11.6–15.2)

## 2015-12-30 LAB — GLUCOSE, CAPILLARY
GLUCOSE-CAPILLARY: 104 mg/dL — AB (ref 65–99)
GLUCOSE-CAPILLARY: 104 mg/dL — AB (ref 65–99)

## 2015-12-30 MED ORDER — WARFARIN SODIUM 5 MG PO TABS: 10.0000 mg | ORAL_TABLET | ORAL | Status: DC

## 2015-12-30 MED ORDER — POTASSIUM CHLORIDE ER 20 MEQ PO TBCR: 20.0000 meq | EXTENDED_RELEASE_TABLET | Freq: Every day | ORAL | Status: DC

## 2015-12-30 MED ORDER — DIGOXIN 125 MCG PO TABS: 0.1250 mg | ORAL_TABLET | Freq: Every day | ORAL | Status: DC

## 2015-12-30 MED ORDER — FUROSEMIDE 20 MG PO TABS
40.0000 mg | ORAL_TABLET | Freq: Two times a day (BID) | ORAL | Status: DC
Start: 2015-12-30 — End: 2016-02-18

## 2015-12-30 MED ORDER — DIGOXIN 125 MCG PO TABS
0.1250 mg | ORAL_TABLET | Freq: Every day | ORAL | Status: DC
  Administered 2015-12-30: 0.125 mg via ORAL
  Filled 2015-12-30: qty 1

## 2015-12-30 MED ORDER — WARFARIN SODIUM 7.5 MG PO TABS: 15.0000 mg | ORAL_TABLET | Freq: Once | ORAL | Status: DC

## 2015-12-30 MED ORDER — ATORVASTATIN CALCIUM 80 MG PO TABS: 80.0000 mg | ORAL_TABLET | Freq: Every day | ORAL | Status: DC

## 2015-12-30 NOTE — Progress Notes (Signed)
Patient discharge teaching given, including activity, diet, follow-up appoints, and medications. Patient verbalized understanding of all discharge instructions. IV access was d/c'd. Vitals are stable. Skin is intact except as charted in most recent assessments. Pt to be escorted out by NT, to be driven home by family. 

## 2015-12-30 NOTE — Progress Notes (Signed)
Subjective:   Much improved with IV diuresis. No SOB. Orthopnea resolved.     Intake/Output Summary (Last 24 hours) at 12/30/15 1206 Last data filed at 12/30/15 0900  Gross per 24 hour  Intake    480 ml  Output   1050 ml  Net   -570 ml    Current meds: . atorvastatin  80 mg Oral Daily  . feeding supplement (ENSURE ENLIVE)  237 mL Oral BID BM  . furosemide  40 mg Intravenous BID  . glipiZIDE  10 mg Oral BID WC  . insulin aspart  0-5 Units Subcutaneous QHS  . insulin aspart  0-9 Units Subcutaneous TID WC  . lisinopril  40 mg Oral Daily  . metoprolol succinate  25 mg Oral Daily  . potassium chloride  20 mEq Oral BID  . warfarin  15 mg Oral ONCE-1800  . Warfarin - Pharmacist Dosing Inpatient   Does not apply q1800   Infusions:     Objective:  Blood pressure 111/78, pulse 102, temperature 98.2 F (36.8 C), temperature source Oral, resp. rate 16, height 6' (1.829 m), weight 96.4 kg (212 lb 8.4 oz), SpO2 97 %. Weight change:   Physical Exam: General: Well developed, well nourished AAM in no acute distress. Head: Normocephalic, atraumatic, sclera non-icteric, no xanthomas, nares are without discharge. Neck: Negative for carotid bruits. JVP 6-7 Lungs: Clear bilaterally to auscultation without wheezes, rales, or rhonchi. Breathing is unlabored. Heart: PMI laterally displaced regular with S1 S2. + s3 Abdomen: Soft, non-tender, non-distended with normoactive bowel sounds. No hepatomegaly. No rebound/guarding. No obvious abdominal masses. Msk: Strength and tone appear normal for age. Extremities: No clubbing or cyanosis. No edema. Distal pedal pulses are 2+ and equal bilaterally. Neuro: Alert and oriented X 3. Left facial droop  Moves all extremities spontaneously. Psych: Responds to questions   Telemetry: NSR 90-100  Lab Results: Basic Metabolic Panel:  Recent Labs Lab 12/26/15 1545 12/27/15 0428 12/29/15 0535 12/30/15 0636  NA 143 144 144 143  K 4.0 3.7  3.6 3.9  CL 108 110 111 109  CO2 24 25 24 27   GLUCOSE 150* 106* 93 98  BUN 14 15 16 16   CREATININE 1.32* 1.19 1.22 1.16  CALCIUM 9.2 8.7* 8.5* 8.7*   Liver Function Tests:  Recent Labs Lab 12/26/15 1545 12/27/15 0428  AST 22 18  ALT 45 38  ALKPHOS 109 92  BILITOT 1.1 0.8  PROT 6.7 5.6*  ALBUMIN 3.7 2.9*   No results for input(s): LIPASE, AMYLASE in the last 168 hours. No results for input(s): AMMONIA in the last 168 hours. CBC:  Recent Labs Lab 12/26/15 1545 12/27/15 0428 12/28/15 0553 12/29/15 0535 12/30/15 0636  WBC 5.5 6.4 5.5 5.6 5.8  NEUTROABS 3.1  --   --   --   --   HGB 13.9 12.5* 13.7 13.2 13.1  HCT 43.0 39.2 41.5 39.7 40.9  MCV 98.4 98.0 97.6 97.5 97.8  PLT 321 296 287 285 296   Cardiac Enzymes: No results for input(s): CKTOTAL, CKMB, CKMBINDEX, TROPONINI in the last 168 hours. BNP: Invalid input(s): POCBNP CBG:  Recent Labs Lab 12/29/15 0758 12/29/15 1208 12/29/15 1659 12/29/15 2136 12/30/15 0758  GLUCAP 94 121* 80 167* 104*   Microbiology: No results found for: CULT No results for input(s): CULT, SDES in the last 168 hours.  Imaging: No results found.   ASSESSMENT/PLAN:   1. Acute on chronic systolic CHF/NICM - EF 25%. Recent LHC with only mild CAD.  2. Acute R MCA stroke in the setting of known hypercoagulable state (protein C/S deficiency)   3. 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 Continue anti-coagulation   4. Hyperlipidemia - now on high dose statin.  5. Essential HTN   6. Hypoxia - see above. May need home O2 per primary team  Volume status much improved with IV lasix. Can go home today on lasix 40 daily. Reinforced need for daily weights and reviewed use of sliding scale diuretics. Can increase lasix to 60 daily as needed. Continue lisinopril 40 daily (will eventually need switch to [facility]). Continue low dose Toprol. Add digoxin 0.125. He wants to [facility]. Will arrange f/u.    LOS:  3 days    Arvilla Meres, MD 12/30/2015, 12:06 PM

## 2015-12-30 NOTE — Progress Notes (Signed)
ANTICOAGULATION CONSULT NOTE  Pharmacy Consult for warfarin Indication: pulmonary embolus  No Known Allergies  Patient Measurements: Height: 6' (182.9 cm) Weight: 212 lb 8.4 oz (96.4 kg) IBW/kg (Calculated) : 77.6  Vital Signs: Temp: 98.2 F (36.8 C) (01/29 0427) Temp Source: Oral (01/29 0427) BP: 111/78 mmHg (01/29 0844) Pulse Rate: 102 (01/29 0844)  Labs:  Recent Labs  12/28/15 0553 12/29/15 0535 12/30/15 0636  HGB 13.7 13.2 13.1  HCT 41.5 39.7 40.9  PLT 287 285 296  LABPROT 16.2* 21.6* 22.2*  INR 1.29 1.89* 1.96*  CREATININE  --  1.22 1.16    Estimated Creatinine Clearance: 91.7 mL/min (by C-G formula based on Cr of 1.16).  Assessment: 20 YOM admitted with stroke symptoms. Was diagnosed with saddle PE in November 2016 at [facility] and was on warfarin PTA for protein C&S deficiency. Per notes, he ran out of warfarin and was not taking it for a few days prior to coming in. INR up to 1.96 today.  Home dose is 10mg  daily except 15mg  on Wednesdays.  Per stroke note, no bridging with Lovenox, to continue aspirin until INR is therapeutic. Currently on Lovenox 40mg  subQ q24h (prophylactic dosing), and ASA 324mg  PO daily.   Looking in CareEverywhere, He was being f/u at [facility] for his anticoag. He gets his meds at the [facility]. My guess is due to insurance coverage issue. That may be the reason for Coumadin instead of NOACs  Goal of Therapy:  INR 2-3 Monitor platelets by anticoagulation protocol: Yes   Plan:   Coumadin 15mg  PO x1 daily INR continue Lovenox and ASA until INR therapeutic or until changes made by MD  Ulyses Southward, PharmD Pager: [phone] 12/30/2015 11:25 AM

## 2015-12-30 NOTE — Discharge Summary (Signed)
Ian Foster, is a 50 y.o. male  DOB 04/03/1966  MRN [id number].  Admission date:  12/26/2015  Admitting Physician  Eduard Clos, MD  Discharge Date:  12/30/2015   Primary MD  No primary care provider on file.  Recommendations for primary care physician for things to follow:   Check CBC, BMP, INR, in 2-3 days   Admission Diagnosis  Stroke [facility]) [I63.9]   Discharge Diagnosis  Stroke [facility]) [I63.9]     Principal Problem:   Acute right MCA stroke (HCC) Active Problems:   CVA (cerebral infarction)   LV dysfunction   Type 2 diabetes mellitus with vascular disease (HCC)   Hypertension   History of pulmonary embolism   Stroke (cerebrum) (HCC)   Hypoxia   Hyperlipidemia   Acute on chronic systolic CHF (congestive heart failure) (HCC)   NICM (nonischemic cardiomyopathy) (HCC)   Mild CAD      Past Medical History  Diagnosis Date  . Pulmonary embolism (HCC)     a. h/o saddle PE 10/2015.  Marland Kitchen Hypertension   . Diabetes mellitus without complication (HCC)   . Chronic systolic CHF (congestive heart failure) (HCC)     a. EF 25% in 10/2015, 20-25% in 12/13/15 at [facility]. b. EF 10-15% by echo 12/27/15 at [facility].  Marland Kitchen NICM (nonischemic cardiomyopathy) (HCC)   . Mild CAD     a. cardiac cath 10/11/15 showing patent coronaries with mild nonobstructive CAD (20% pLAD, 20% mLAD, 30% pCx, 20% pRCA, 20% mRCA, 20% dRCA, 30% OM2).  . DVT (deep venous thrombosis) (HCC)   . Protein C deficiency (HCC)   . Protein S deficiency (HCC)   . CVA (cerebral infarction)     a. 12/2015: R MCA stroke in setting of subtherapeutic INR/coumadin noncompliance.  . Hyperlipidemia   . Aortic insufficiency     a. 2D echo 12/27/15: mild-mod AI.  Marland Kitchen Mitral regurgitation     a. 2D echo 12/27/15: mild-mod MR.    History reviewed. No pertinent past  surgical history.     HPI  from the history and physical done on the day of admission:    Ian Foster is a 50 y.o. 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 Denies any associated difficulty swallowing speaking or any weakness of the upper or lower extremity. Facial droop is mostly on the left lower part of the face. Patient came late in the evening around 5:30 to the ER. MRI of the brain shows acute stroke involving the right MCA territory and on-call neurologist has been consulted and patient has been admitted for further stroke workup. Patient has not taken his Coumadin for last couple of days has patient had run out of it. INR is subtherapeutic. Patient denies any chest pain or shortness of breath.     Hospital Course:     1.  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 Full stroke workup being done, neurology on board, currently on aspirin full dose per neurology, he was also on anticoagulation with Coumadin for a recent PE, INR subtherapeutic and he has been placed on Coumadin with pharmacy monitoring. LDL was over 70 and I have doubled his Lipitor dose, A1c is 6, no further recommendations per neurology. Stopped ASA as INR 2 per Neuro. D/W Dr Pearlean Brownie.  2. Recent history of PE with hypercoagulable state. Being followed at [facility]. Coumadin with subtherapeutic INR, as treated with Coumadin and INR 1.96 will be discharged. Patient apparently noncompliant with his Coumadin. Counseled..  3. Dyslipidemia. LDL not at goal Lipitor doubled.  4. Noncompliance with medications. Counseled, he will follow with [facility] upon discharge.  5. Mild acute on Chronic systolic heart failure with EF 25%. On ACE inhibitor continue resume, given 1 dose of IV Lasix now back to PO Lasix, seen and cleared by Cards  .  6. DM type II. A1c 6, continue present regimen .    Discharge Condition: Stable  Follow UP  Follow-up Information    Follow up with RAYMER, DANIELLE, RPH. Schedule an appointment as soon as possible for a visit in 2 days.   Specialty:  Pharmacist   Why:  INR check      Follow up with SETHI,PRAMOD, MD. Schedule an appointment as soon as possible for a visit in 1 week.   Specialties:  Neurology, Radiology   Contact information:   [address] [place] [address] [phone]       Follow up with Arvilla Meres, MD. Schedule an appointment as soon as possible for a visit in 1 week.   Specialty:  Cardiology   Contact information:   [address] [place] [address] [phone]        Consults obtained - Cards, neuro  Diet and Activity recommendation: See Discharge Instructions below  Discharge Instructions           Discharge Instructions    Discharge instructions    Complete by:  As directed   Follow with Primary MD in 7 days   Get CBC, CMP, INR,  2 view Chest X ray checked  by Primary MD next visit.    Activity: As tolerated with Full fall precautions use walker/cane & assistance as needed   Disposition Home     Diet:   Heart Healthy Low Carb.  Accuchecks 4 times/day, Once in AM empty stomach and then before each meal. Log in all results and show them to your Prim.MD in 3 days. If any glucose reading is under 80 or above 300 call your Prim MD immidiately. Follow Low glucose instructions for glucose under 80 as instructed.   For Heart failure patients - Check your Weight same time everyday, if you gain over 2 pounds, or you develop in leg swelling, experience more shortness of breath or chest pain, call your Primary MD immediately. Follow Cardiac Low Salt Diet and 1.5 lit/day fluid restriction.   On your next visit with your primary care physician please Get Medicines reviewed and adjusted.   Please request  your Prim.MD to go over all Hospital Tests and Procedure/Radiological results at the follow up, please get all Hospital records sent to your Prim MD by signing hospital release before you go home.   If you experience worsening of your admission symptoms, develop shortness of breath, life threatening emergency, suicidal or homicidal thoughts you must  seek medical attention immediately by calling 911 or calling your MD immediately  if symptoms less severe.  You Must read complete instructions/literature along with all the possible adverse reactions/side effects for all the Medicines you take and that have been prescribed to you. Take any new Medicines after you have completely understood and accpet all the possible adverse reactions/side effects.   Do not drive, operating heavy machinery, perform activities at heights, swimming or participation in water activities or provide baby sitting services if your were admitted for syncope or siezures until you have seen by Primary MD or a Neurologist and advised to do so again.  Do not drive when taking Pain medications.    Do not take more than prescribed Pain, Sleep and Anxiety Medications  Special Instructions: If you have smoked or chewed Tobacco  in the last 2 yrs please stop smoking, stop any regular Alcohol  and or any Recreational drug use.  Wear Seat belts while driving.   Please note  You were cared for by a hospitalist during your hospital stay. If you have any questions about your discharge medications or the care you received while you were in the hospital after you are discharged, you can call the unit and asked to speak with the hospitalist on call if the hospitalist that took care of you is not available. Once you are discharged, your primary care physician will handle any further medical issues. Please note that NO REFILLS for any discharge medications will be authorized once you are discharged, as it is imperative that you return to your  primary care physician (or establish a relationship with a primary care physician if you do not have one) for your aftercare needs so that they can reassess your need for medications and monitor your lab values.     Increase activity slowly    Complete by:  As directed              Discharge Medications       Medication List    STOP taking these medications        aspirin 325 MG tablet     hydrochlorothiazide 25 MG tablet  Commonly known as:  HYDRODIURIL      TAKE these medications        atorvastatin 80 MG tablet  Commonly known as:  LIPITOR  Take 1 tablet (80 mg total) by mouth daily.     digoxin 0.125 MG tablet  Commonly known as:  LANOXIN  Take 1 tablet (0.125 mg total) by mouth daily.     furosemide 20 MG tablet  Commonly known as:  LASIX  Take 2 tablets (40 mg total) by mouth 2 (two) times daily.     glipiZIDE 10 MG tablet  Commonly known as:  GLUCOTROL  Take 10 mg by mouth 2 (two) times daily.     lisinopril 40 MG tablet  Commonly known as:  PRINIVIL,ZESTRIL  Take 40 mg by mouth daily.     metFORMIN 1000 MG tablet  Commonly known as:  GLUCOPHAGE  Take 1,000 mg by mouth 2 (two) times daily.     metoprolol succinate 25 MG 24 hr tablet  Commonly known as:  TOPROL-XL  Take 25 mg by mouth daily.     Potassium Chloride ER 20 MEQ Tbcr  Take 20 mEq by mouth daily.     warfarin 5 MG tablet  Commonly known as:  COUMADIN  Take 2-3 tablets (10-15 mg total) by mouth See admin instructions. 10  mg on all other days except on Wednesday patient to take 15 mg        Major procedures and Radiology Reports - PLEASE review detailed and final reports for all details, in brief -     CT head, MRI/MRA brain. Confirming right MCA infarct  Echogram  Left ventricle: The cavity size was mildly dilated. Wallthickness was increased in a pattern of moderate LVH. Systolicfunction was normal. The estimated ejection fraction was in therange of 10% to 15%. Severe diffuse  hypokinesis. Regional wall motion abnormalities cannot be excluded. - Aortic valve: There was mild to moderate regurgitation. - Mitral valve: There was mild to moderate regurgitation. - Left atrium: The atrium was moderately to severely dilated.  Carotid duplex  Carotid Duplex (Doppler) has been completed. Preliminary findings: Bilateral: No significant (1-39%) ICA stenosis. Antegrade vertebral flow.   Dg Chest 2 View  12/26/2015  CLINICAL DATA:  Shortness of breath. Bilateral flank pain. Ex-smoker. EXAM: CHEST  2 VIEW COMPARISON:  None. FINDINGS: Enlarged cardiac silhouette. Mild prominence of the pulmonary vasculature and interstitial markings with more prominent interstitial markings in the lower lung zones. Minimal bilateral pleural fluid. Unremarkable bones. IMPRESSION: Cardiomegaly and mild changes of congestive heart failure. Electronically Signed   By: Beckie Salts M.D.   On: 12/26/2015 16:01   Ct Head Wo Contrast  12/26/2015  CLINICAL DATA:  Left arm weakness EXAM: CT HEAD WITHOUT CONTRAST TECHNIQUE: Contiguous axial images were obtained from the base of the skull through the vertex without intravenous contrast. COMPARISON:  None. FINDINGS: The bony calvarium is intact. The ventricles are of normal size and configuration. No findings to suggest acute hemorrhage, acute infarction or space-occupying mass lesion are noted. IMPRESSION: No acute abnormality noted. Electronically Signed   By: Alcide Clever M.D.   On: 12/26/2015 16:16   Mr Maxine Glenn Head Wo Contrast  12/26/2015  CLINICAL DATA:  Initial evaluation for left-sided facial droop. EXAM: MRI HEAD WITHOUT CONTRAST MRA HEAD WITHOUT CONTRAST TECHNIQUE: Multiplanar, multiecho pulse sequences of the brain and surrounding structures were obtained without intravenous contrast. Angiographic images of the head were obtained using MRA technique without contrast. COMPARISON:  Prior CT from earlier the same day. FINDINGS: MRI HEAD FINDINGS Patchy  restricted diffusion involving the right frontal lobe, primarily the operculum, with involvement of the superior right insular cortex. Patchy shifted diffusion extend superiorly and medially into the deep white matter of the right corona radiata S/centrum semi ovale. No involvement of the deep gray nuclei. No associated hemorrhage or mass effect. No other areas of infarction. Major intracranial vascular flow voids are maintained. Cerebral volume within normal limits for patient age. Minimal chronic small vessel ischemic type changes within the periventricular and deep white matter. No chronic infarction. No mass lesion, midline shift or mass effect. No hydrocephalus. No extra-axial fluid collection. No acute or chronic intracranial hemorrhage. Craniocervical junction within normal limits. Pituitary gland normal. No acute abnormality about the orbits. Paranasal sinuses are clear. No mastoid effusion. Inner ear structures normal. Bone marrow signal intensity within normal limits. No scalp soft tissue abnormality. MRA HEAD FINDINGS ANTERIOR CIRCULATION: Distal cervical segments of the internal carotid arteries are widely patent with antegrade flow. Petrous, cavernous, and supraclinoid segments widely patent. A1 segments, anterior communicating artery common anterior cerebral arteries patent without stenosis. M1 segments well opacified without stenosis or thrombosis. MCA bifurcations normal. No proximal M2 branch occlusion. Distal MCA branches symmetric and well opacified. POSTERIOR CIRCULATION: Vertebral arteries patent to the vertebrobasilar junction. Basilar artery widely  patent. Superior cerebellar and posterior cerebral arteries widely patent and well opacified to their distal aspects. Small posterior communicating arteries and about laterally. No aneurysm. IMPRESSION: MRI HEAD IMPRESSION: 1. Patchy acute ischemic multi focal right MCA territory infarcts as above. No significant mass effect or associated  hemorrhage. 2. Minimal chronic small vessel ischemic disease. MRA HEAD IMPRESSION: Normal intracranial MRA. No proximal or large arterial branch occlusion. No hemodynamically significant or correctable stenosis Electronically Signed   By: Rise Mu M.D.   On: 12/26/2015 23:27   Mr Brain Wo Contrast  12/26/2015  CLINICAL DATA:  Initial evaluation for left-sided facial droop. EXAM: MRI HEAD WITHOUT CONTRAST MRA HEAD WITHOUT CONTRAST TECHNIQUE: Multiplanar, multiecho pulse sequences of the brain and surrounding structures were obtained without intravenous contrast. Angiographic images of the head were obtained using MRA technique without contrast. COMPARISON:  Prior CT from earlier the same day. FINDINGS: MRI HEAD FINDINGS Patchy restricted diffusion involving the right frontal lobe, primarily the operculum, with involvement of the superior right insular cortex. Patchy shifted diffusion extend superiorly and medially into the deep white matter of the right corona radiata S/centrum semi ovale. No involvement of the deep gray nuclei. No associated hemorrhage or mass effect. No other areas of infarction. Major intracranial vascular flow voids are maintained. Cerebral volume within normal limits for patient age. Minimal chronic small vessel ischemic type changes within the periventricular and deep white matter. No chronic infarction. No mass lesion, midline shift or mass effect. No hydrocephalus. No extra-axial fluid collection. No acute or chronic intracranial hemorrhage. Craniocervical junction within normal limits. Pituitary gland normal. No acute abnormality about the orbits. Paranasal sinuses are clear. No mastoid effusion. Inner ear structures normal. Bone marrow signal intensity within normal limits. No scalp soft tissue abnormality. MRA HEAD FINDINGS ANTERIOR CIRCULATION: Distal cervical segments of the internal carotid arteries are widely patent with antegrade flow. Petrous, cavernous, and  supraclinoid segments widely patent. A1 segments, anterior communicating artery common anterior cerebral arteries patent without stenosis. M1 segments well opacified without stenosis or thrombosis. MCA bifurcations normal. No proximal M2 branch occlusion. Distal MCA branches symmetric and well opacified. POSTERIOR CIRCULATION: Vertebral arteries patent to the vertebrobasilar junction. Basilar artery widely patent. Superior cerebellar and posterior cerebral arteries widely patent and well opacified to their distal aspects. Small posterior communicating arteries and about laterally. No aneurysm. IMPRESSION: MRI HEAD IMPRESSION: 1. Patchy acute ischemic multi focal right MCA territory infarcts as above. No significant mass effect or associated hemorrhage. 2. Minimal chronic small vessel ischemic disease. MRA HEAD IMPRESSION: Normal intracranial MRA. No proximal or large arterial branch occlusion. No hemodynamically significant or correctable stenosis Electronically Signed   By: Rise Mu M.D.   On: 12/26/2015 23:27    Micro Results      No results found for this or any previous visit (from the past 240 hour(s)).  Today   Subjective    Ian Foster today has no headache,no chest abdominal pain,no new weakness tingling or numbness, feels much better wants to go home today.    Objective   Blood pressure 111/78, pulse 102, temperature 98.2 F (36.8 C), temperature source Oral, resp. rate 16, height 6' (1.829 m), weight 96.4 kg (212 lb 8.4 oz), SpO2 97 %.   Intake/Output Summary (Last 24 hours) at 12/30/15 1242 Last data filed at 12/30/15 0900  Gross per 24 hour  Intake    480 ml  Output   1050 ml  Net   -570 ml    Exam Awake  Alert, Oriented x 3, No new F.N deficits, Normal affect [place].AT,PERRAL Supple Neck,No JVD, No cervical lymphadenopathy appriciated.  Symmetrical Chest wall movement, Good air movement bilaterally, CTAB RRR,No Gallops,Rubs or new Murmurs, No Parasternal  Heave +ve B.Sounds, Abd Soft, Non tender, No organomegaly appriciated, No rebound -guarding or rigidity. No Cyanosis, Clubbing or edema, No new Rash or bruise   Data Review   CBC w Diff:  Lab Results  Component Value Date   WBC 5.8 12/30/2015   HGB 13.1 12/30/2015   HCT 40.9 12/30/2015   PLT 296 12/30/2015   LYMPHOPCT 38 12/26/2015   MONOPCT 4 12/26/2015   EOSPCT 1 12/26/2015   BASOPCT 0 12/26/2015    CMP:  Lab Results  Component Value Date   NA 143 12/30/2015   K 3.9 12/30/2015   CL 109 12/30/2015   CO2 27 12/30/2015   BUN 16 12/30/2015   CREATININE 1.16 12/30/2015   PROT 5.6* 12/27/2015   ALBUMIN 2.9* 12/27/2015   BILITOT 0.8 12/27/2015   ALKPHOS 92 12/27/2015   AST 18 12/27/2015   ALT 38 12/27/2015  . Lab Results  Component Value Date   INR 1.96* 12/30/2015   INR 1.89* 12/29/2015   INR 1.29 12/28/2015    Total Time in preparing paper work, data evaluation and todays exam - 35 minutes  Leroy Sea M.D on 12/30/2015 at 12:42 PM  [facility]   Office  [phone]

## 2015-12-30 NOTE — Discharge Instructions (Signed)
Follow with Primary MD in 7 days   Get CBC, CMP, INR,  2 view Chest X ray checked  by Primary MD next visit.    Activity: As tolerated with Full fall precautions use walker/cane & assistance as needed   Disposition Home     Diet:   Heart Healthy Low Carb.  Accuchecks 4 times/day, Once in AM empty stomach and then before each meal. Log in all results and show them to your Prim.MD in 3 days. If any glucose reading is under 80 or above 300 call your Prim MD immidiately. Follow Low glucose instructions for glucose under 80 as instructed.   For Heart failure patients - Check your Weight same time everyday, if you gain over 2 pounds, or you develop in leg swelling, experience more shortness of breath or chest pain, call your Primary MD immediately. Follow Cardiac Low Salt Diet and 1.5 lit/day fluid restriction.   On your next visit with your primary care physician please Get Medicines reviewed and adjusted.   Please request your Prim.MD to go over all Hospital Tests and Procedure/Radiological results at the follow up, please get all Hospital records sent to your Prim MD by signing hospital release before you go home.   If you experience worsening of your admission symptoms, develop shortness of breath, life threatening emergency, suicidal or homicidal thoughts you must seek medical attention immediately by calling 911 or calling your MD immediately  if symptoms less severe.  You Must read complete instructions/literature along with all the possible adverse reactions/side effects for all the Medicines you take and that have been prescribed to you. Take any new Medicines after you have completely understood and accpet all the possible adverse reactions/side effects.   Do not drive, operating heavy machinery, perform activities at heights, swimming or participation in water activities or provide baby sitting services if your were admitted for syncope or siezures until you have seen by Primary  MD or a Neurologist and advised to do so again.  Do not drive when taking Pain medications.    Do not take more than prescribed Pain, Sleep and Anxiety Medications  Special Instructions: If you have smoked or chewed Tobacco  in the last 2 yrs please stop smoking, stop any regular Alcohol  and or any Recreational drug use.  Wear Seat belts while driving.   Please note  You were cared for by a hospitalist during your hospital stay. If you have any questions about your discharge medications or the care you received while you were in the hospital after you are discharged, you can call the unit and asked to speak with the hospitalist on call if the hospitalist that took care of you is not available. Once you are discharged, your primary care physician will handle any further medical issues. Please note that NO REFILLS for any discharge medications will be authorized once you are discharged, as it is imperative that you return to your primary care physician (or establish a relationship with a primary care physician if you do not have one) for your aftercare needs so that they can reassess your need for medications and monitor your lab values.

## 2015-12-31 ENCOUNTER — Telehealth (HOSPITAL_COMMUNITY): Payer: Self-pay | Admitting: Cardiology

## 2015-12-31 NOTE — Telephone Encounter (Signed)
-----   Message from Dolores Patty, MD sent at 12/30/2015 12:10 PM EST ----- Just discharged from [facility]. Please arrange HF f/u in 2 weeks. Thanks

## 2015-12-31 NOTE — Telephone Encounter (Signed)
Attempting to schedule follow up 

## 2016-01-04 NOTE — Telephone Encounter (Signed)
F/u 2/16 @ 240

## 2016-01-17 ENCOUNTER — Ambulatory Visit (HOSPITAL_COMMUNITY)
Admission: RE | Admit: 2016-01-17 | Discharge: 2016-01-17 | Disposition: A | Discharge status: Home / Self Care | Payer: Medicaid Other | Source: Ambulatory Visit | Attending: Internal Medicine | Admitting: Internal Medicine

## 2016-01-17 ENCOUNTER — Encounter (HOSPITAL_COMMUNITY): Payer: Self-pay | Admitting: Internal Medicine

## 2016-01-17 VITALS — BP 108/66 | HR 80 | Wt 210.0 lb

## 2016-01-17 DIAGNOSIS — Z8249 Family history of ischemic heart disease and other diseases of the circulatory system: Secondary | ICD-10-CM | POA: Insufficient documentation

## 2016-01-17 DIAGNOSIS — Z87891 Personal history of nicotine dependence: Secondary | ICD-10-CM | POA: Insufficient documentation

## 2016-01-17 DIAGNOSIS — I110 Hypertensive heart disease with heart failure: Secondary | ICD-10-CM | POA: Insufficient documentation

## 2016-01-17 DIAGNOSIS — I2510 Atherosclerotic heart disease of native coronary artery without angina pectoris: Secondary | ICD-10-CM | POA: Insufficient documentation

## 2016-01-17 DIAGNOSIS — Z7984 Long term (current) use of oral hypoglycemic drugs: Secondary | ICD-10-CM | POA: Insufficient documentation

## 2016-01-17 DIAGNOSIS — Z86718 Personal history of other venous thrombosis and embolism: Secondary | ICD-10-CM | POA: Diagnosis not present

## 2016-01-17 DIAGNOSIS — Z8673 Personal history of transient ischemic attack (TIA), and cerebral infarction without residual deficits: Secondary | ICD-10-CM | POA: Diagnosis not present

## 2016-01-17 DIAGNOSIS — I428 Other cardiomyopathies: Secondary | ICD-10-CM | POA: Diagnosis not present

## 2016-01-17 DIAGNOSIS — Z79899 Other long term (current) drug therapy: Secondary | ICD-10-CM | POA: Insufficient documentation

## 2016-01-17 DIAGNOSIS — Z86711 Personal history of pulmonary embolism: Secondary | ICD-10-CM

## 2016-01-17 DIAGNOSIS — I5023 Acute on chronic systolic (congestive) heart failure: Secondary | ICD-10-CM

## 2016-01-17 DIAGNOSIS — E785 Hyperlipidemia, unspecified: Secondary | ICD-10-CM | POA: Diagnosis not present

## 2016-01-17 DIAGNOSIS — E119 Type 2 diabetes mellitus without complications: Secondary | ICD-10-CM | POA: Insufficient documentation

## 2016-01-17 DIAGNOSIS — Z7901 Long term (current) use of anticoagulants: Secondary | ICD-10-CM | POA: Insufficient documentation

## 2016-01-17 DIAGNOSIS — D6859 Other primary thrombophilia: Secondary | ICD-10-CM | POA: Insufficient documentation

## 2016-01-17 DIAGNOSIS — I080 Rheumatic disorders of both mitral and aortic valves: Secondary | ICD-10-CM | POA: Insufficient documentation

## 2016-01-17 DIAGNOSIS — I5022 Chronic systolic (congestive) heart failure: Secondary | ICD-10-CM | POA: Diagnosis not present

## 2016-01-17 LAB — BASIC METABOLIC PANEL
Anion gap: 16 — ABNORMAL HIGH (ref 5–15)
BUN: 21 mg/dL — ABNORMAL HIGH (ref 6–20)
CHLORIDE: 104 mmol/L (ref 101–111)
CO2: 25 mmol/L (ref 22–32)
CREATININE: 1.54 mg/dL — AB (ref 0.61–1.24)
Calcium: 10 mg/dL (ref 8.9–10.3)
GFR calc non Af Amer: 51 mL/min — ABNORMAL LOW (ref >60)
GFR, EST AFRICAN AMERICAN: 59 mL/min — AB (ref >60)
Glucose, Bld: 66 mg/dL (ref 65–99)
Potassium: 4.3 mmol/L (ref 3.5–5.1)
SODIUM: 145 mmol/L (ref 135–145)

## 2016-01-17 LAB — DIGOXIN LEVEL: DIGOXIN LVL: 0.5 ng/mL — AB (ref 0.8–2.0)

## 2016-01-17 MED ORDER — SACUBITRIL-VALSARTAN 49-51 MG PO TABS: 1.0000 | ORAL_TABLET | Freq: Two times a day (BID) | ORAL | Status: DC

## 2016-01-17 NOTE — Patient Instructions (Signed)
Stop Lisinopril  Start Entresto 49/51 mg .bid   Labs today  Your physician has recommended that you have a cardiopulmonary stress test (CPX). CPX testing is a non-invasive measurement of heart and lung function. It replaces a traditional treadmill stress test. This type of test provides a tremendous amount of information that relates not only to your present condition but also for future outcomes. This test combines measurements of you ventilation, respiratory gas exchange in the lungs, electrocardiogram (EKG), blood pressure and physical response before, during, and following an exercise protocol.  Your physician recommends that you schedule a follow-up appointment in: 4 weeks

## 2016-01-17 NOTE — Progress Notes (Signed)
ADVANCED HF CLINIC NOTE  Patient ID: Ian Foster, male   DOB: 1966/11/09, 50 y.o.   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 DM, HTN, HLD, acute MCA stroke in the setting of INR 1.21  He has been followed at [facility] by Ian Foster for his cardiology care. He was found to have EF 25-30% at time of his saddle PE diagnosis in 10/2015 with cardiac cath 10/11/15 showing patent coronaries with mild nonobstructive CAD (20% pLAD, 20% mLAD, 30% pCx, 20% pRCA, 20% mRCA, 20% dRCA, 30% OM2).   He presented to [facility] in January 2017 with symptoms of left facial droop and drooling from the left side of the mouth. He reported running out of Coumadin several days prior to admission - says he was only given 30 tablets but due to his dose being extra tablets on certain days, he ran out early. He say he has to get his meds through a crisis center so obtaining refills is difficult.Marland Kitchen MRI showed right MCA distrubution infarct. Carotid duplex 12/27/15: no significant (1-39%) ICA stenosis. 2D echo in our system 12/27/15 showed mod LVH, EF 10-15%, severe diffuse HK, RWMA cannot be excluded, mild-mod AI/MR, mod-severely dilated LA. Prior to d/c he was diuresed and there was concern for low output HF. D/C weight 212.  Presents for post-hospital f/u.  Taking lasix 40 mg bid. Not weighing at home because he doesn't have a scale. No residual neuro deficits. Can walk about 2 blocks before getting tired. No orthopnea, PND or edema. Says he feels pretty good. Taking all meds.   ROS: All systems negative except as listed in HPI, PMH and Problem List.  SH:  Social History   Social History  . Marital Status: Single    Spouse Name: N/A  . Number of Children: N/A  . Years of Education: N/A   Occupational History  .  Not on file.   Social History Main Topics  . Smoking status: Former Smoker    Types: Cigarettes  . Smokeless tobacco: Not on file     Comment: pt quit 6 years ago  . Alcohol Use: No  . Drug Use: No  . Sexual Activity: Not on file   Other Topics Concern  . Not on file   Social History Narrative    FH:  Family History  Problem Relation Age of Onset  . Hypertension Mother     Past Medical History  Diagnosis Date  . Pulmonary embolism (HCC)     a. h/o saddle PE 10/2015.  Marland Kitchen Hypertension   . Diabetes mellitus without complication (HCC)   . Chronic systolic CHF (congestive heart failure) (HCC)     a. EF 25% in 10/2015, 20-25% in 12/13/15 at [facility]. b. EF 10-15% by echo 12/27/15 at [facility].  Marland Kitchen NICM (nonischemic cardiomyopathy) (HCC)   . Mild CAD     a. cardiac cath 10/11/15 showing patent coronaries with mild nonobstructive CAD (20% pLAD, 20% mLAD, 30% pCx, 20% pRCA, 20% mRCA, 20% dRCA, 30% OM2).  . DVT (deep venous thrombosis) (HCC)   . Protein C deficiency (HCC)   . Protein S deficiency (HCC)   . CVA (cerebral infarction)     a. 12/2015: R MCA stroke in setting of subtherapeutic INR/coumadin noncompliance.  . Hyperlipidemia   .  Aortic insufficiency     a. 2D echo 12/27/15: mild-mod AI.  Marland Kitchen Mitral regurgitation     a. 2D echo 12/27/15: mild-mod MR.    Current Outpatient Prescriptions  Medication Sig Dispense Refill  . atorvastatin (LIPITOR) 80 MG tablet Take 1 tablet (80 mg total) by mouth daily. 30 tablet 0  . digoxin (LANOXIN) 0.125 MG tablet Take 1 tablet (0.125 mg total) by mouth daily. 30 tablet 0  . furosemide (LASIX) 20 MG tablet Take 2 tablets (40 mg total) by mouth 2 (two) times daily. 60 tablet 0  . glipiZIDE (GLUCOTROL) 10 MG tablet Take 10 mg by mouth 2 (two) times daily.    Marland Kitchen lisinopril (PRINIVIL,ZESTRIL) 40 MG tablet Take 40 mg by mouth daily.    . metFORMIN (GLUCOPHAGE) 1000 MG tablet Take 1,000 mg by mouth 2 (two) times daily.    . metoprolol succinate (TOPROL-XL)  25 MG 24 hr tablet Take 25 mg by mouth daily.    . potassium chloride 20 MEQ TBCR Take 20 mEq by mouth daily. 30 tablet 0  . warfarin (COUMADIN) 5 MG tablet Take 2-3 tablets (10-15 mg total) by mouth See admin instructions. 10 mg on all other days except on Wednesday patient to take 15 mg 60 tablet 0   No current facility-administered medications for this encounter.    Filed Vitals:   01/17/16 1417  BP: 108/66  Pulse: 80  Weight: 210 lb (95.255 kg)  SpO2: 97%    PHYSICAL EXAM:  General:  Well appearing. No resp difficulty HEENT: normal Neck: supple. JVP flat. Carotids 2+ bilaterally; no bruits. No lymphadenopathy or thryomegaly appreciated. Cor: PMI laterally displaced. Regular rate & rhythm. No rubs, gallops. 2/6 MR Lungs: clear Abdomen: soft, nontender, nondistended. No hepatosplenomegaly. No bruits or masses. Good bowel sounds. Extremities: no cyanosis, clubbing, rash, edema Neuro: alert & orientedx3, cranial nerves grossly intact. Moves all 4 extremities w/o difficulty. Affect pleasant.    ASSESSMENT & PLAN: 1. Chronic systolic HF - due to NICM. EF 10-15% by echo 1/17. Cath 2016 non-obstructive CAD [facility]) -NYHA II-III -Volume status looks good.  -Continue lasix 40 bid -Change lisinopril to entresto 49/51. Pharmacy to help make sure he can afford meds -Continue Toprol 25 daily and digoixn (can increase Toprol at next visit) -Check labs today -Arrange CPX - susect HF limitation worse than he is reporting -Scale provided in Clinic 2. Multiple PE/DVTs -H/o protein C&S deficiency  -Continue coumadin 3. Recent MCA stroke -Full recovery -Continue statin and coumadin  4. DM2 - per primary 5. CAD - non-obstructive per cath 2016. Off ASA due to warfarin. Continue statin.    Ian Zumwalt,MD 6:43 PM

## 2016-01-30 ENCOUNTER — Encounter (HOSPITAL_COMMUNITY): Payer: Self-pay

## 2016-02-18 ENCOUNTER — Other Ambulatory Visit (HOSPITAL_COMMUNITY): Payer: Self-pay | Admitting: *Deleted

## 2016-02-18 MED ORDER — POTASSIUM CHLORIDE ER 20 MEQ PO TBCR: 20.0000 meq | EXTENDED_RELEASE_TABLET | Freq: Every day | ORAL | Status: DC

## 2016-02-18 MED ORDER — ATORVASTATIN CALCIUM 80 MG PO TABS: 80.0000 mg | ORAL_TABLET | Freq: Every day | ORAL | Status: DC

## 2016-02-18 MED ORDER — DIGOXIN 125 MCG PO TABS: 0.1250 mg | ORAL_TABLET | Freq: Every day | ORAL | Status: DC

## 2016-02-18 MED ORDER — METOPROLOL SUCCINATE ER 25 MG PO TB24: 25.0000 mg | ORAL_TABLET | Freq: Every day | ORAL | Status: DC

## 2016-02-18 MED ORDER — FUROSEMIDE 20 MG PO TABS: 40.0000 mg | ORAL_TABLET | Freq: Two times a day (BID) | ORAL | Status: DC

## 2016-02-18 MED ORDER — WARFARIN SODIUM 5 MG PO TABS: 10.0000 mg | ORAL_TABLET | ORAL | Status: DC

## 2016-02-21 ENCOUNTER — Other Ambulatory Visit (HOSPITAL_COMMUNITY): Payer: Self-pay | Admitting: *Deleted

## 2016-02-21 MED ORDER — METOPROLOL SUCCINATE ER 25 MG PO TB24: 25.0000 mg | ORAL_TABLET | Freq: Every day | ORAL | Status: DC

## 2016-02-21 MED ORDER — WARFARIN SODIUM 5 MG PO TABS: 10.0000 mg | ORAL_TABLET | ORAL | Status: DC

## 2016-02-21 MED ORDER — DIGOXIN 125 MCG PO TABS: 0.1250 mg | ORAL_TABLET | Freq: Every day | ORAL | Status: DC

## 2016-02-21 MED ORDER — SACUBITRIL-VALSARTAN 49-51 MG PO TABS: 1.0000 | ORAL_TABLET | Freq: Two times a day (BID) | ORAL | Status: DC

## 2016-02-21 MED ORDER — ATORVASTATIN CALCIUM 80 MG PO TABS: 80.0000 mg | ORAL_TABLET | Freq: Every day | ORAL | Status: DC

## 2016-02-21 MED ORDER — FUROSEMIDE 20 MG PO TABS: 40.0000 mg | ORAL_TABLET | Freq: Two times a day (BID) | ORAL | Status: DC

## 2016-02-21 MED ORDER — POTASSIUM CHLORIDE ER 20 MEQ PO TBCR: 20.0000 meq | EXTENDED_RELEASE_TABLET | Freq: Every day | ORAL | Status: DC

## 2016-02-27 ENCOUNTER — Encounter (HOSPITAL_COMMUNITY): Payer: Self-pay | Admitting: Internal Medicine

## 2016-03-05 ENCOUNTER — Other Ambulatory Visit (HOSPITAL_COMMUNITY): Payer: Self-pay | Admitting: *Deleted

## 2016-03-05 MED ORDER — WARFARIN SODIUM 5 MG PO TABS: 10.0000 mg | ORAL_TABLET | ORAL | Status: AC

## 2016-03-05 MED ORDER — POTASSIUM CHLORIDE ER 20 MEQ PO TBCR: 20.0000 meq | EXTENDED_RELEASE_TABLET | Freq: Every day | ORAL | Status: AC

## 2016-03-05 MED ORDER — ATORVASTATIN CALCIUM 80 MG PO TABS: 80.0000 mg | ORAL_TABLET | Freq: Every day | ORAL | Status: AC

## 2016-03-05 MED ORDER — DIGOXIN 125 MCG PO TABS: 0.1250 mg | ORAL_TABLET | Freq: Every day | ORAL | Status: AC

## 2016-03-05 MED ORDER — SACUBITRIL-VALSARTAN 49-51 MG PO TABS: 1.0000 | ORAL_TABLET | Freq: Two times a day (BID) | ORAL | Status: DC

## 2016-03-05 MED ORDER — METOPROLOL SUCCINATE ER 25 MG PO TB24: 25.0000 mg | ORAL_TABLET | Freq: Every day | ORAL | Status: AC

## 2016-03-05 MED ORDER — FUROSEMIDE 20 MG PO TABS: 40.0000 mg | ORAL_TABLET | Freq: Two times a day (BID) | ORAL | Status: DC

## 2016-03-24 ENCOUNTER — Encounter (HOSPITAL_COMMUNITY): Payer: Self-pay

## 2016-03-24 ENCOUNTER — Encounter (HOSPITAL_COMMUNITY): Payer: Self-pay | Admitting: Internal Medicine

## 2016-04-15 ENCOUNTER — Telehealth (HOSPITAL_COMMUNITY): Payer: Self-pay | Admitting: Pharmacist

## 2016-04-15 NOTE — Telephone Encounter (Signed)
Entresto PA approved by [facility] through 11/30/38.   Tyler Deis. Bonnye Fava, PharmD, BCPS, CPP Clinical Pharmacist Pager: [phone] Phone: [phone] 04/15/2016 11:20 AM

## 2016-07-15 ENCOUNTER — Other Ambulatory Visit: Payer: Self-pay | Admitting: Orthopedic Surgery

## 2016-07-15 DIAGNOSIS — I519 Heart disease, unspecified: Secondary | ICD-10-CM

## 2016-07-15 DIAGNOSIS — E138 Other specified diabetes mellitus with unspecified complications: Secondary | ICD-10-CM

## 2016-07-25 ENCOUNTER — Other Ambulatory Visit (HOSPITAL_COMMUNITY): Payer: Medicaid Other

## 2016-07-31 ENCOUNTER — Telehealth (HOSPITAL_COMMUNITY): Payer: Self-pay | Admitting: *Deleted

## 2016-07-31 NOTE — Telephone Encounter (Signed)
Disability requested records from Echocardiogram scheduled for 07/25/16.  Fax sent to disability that patient No showed and did not reschedule.

## 2016-08-20 ENCOUNTER — Ambulatory Visit (HOSPITAL_COMMUNITY): Payer: Disability Insurance | Attending: Cardiology

## 2016-08-20 ENCOUNTER — Other Ambulatory Visit: Payer: Self-pay

## 2016-08-20 ENCOUNTER — Encounter (INDEPENDENT_AMBULATORY_CARE_PROVIDER_SITE_OTHER): Payer: Self-pay

## 2016-08-20 DIAGNOSIS — I2510 Atherosclerotic heart disease of native coronary artery without angina pectoris: Secondary | ICD-10-CM | POA: Diagnosis not present

## 2016-08-20 DIAGNOSIS — I351 Nonrheumatic aortic (valve) insufficiency: Secondary | ICD-10-CM | POA: Diagnosis not present

## 2016-08-20 DIAGNOSIS — I119 Hypertensive heart disease without heart failure: Secondary | ICD-10-CM | POA: Diagnosis not present

## 2016-08-20 DIAGNOSIS — E785 Hyperlipidemia, unspecified: Secondary | ICD-10-CM | POA: Diagnosis not present

## 2016-08-20 DIAGNOSIS — I340 Nonrheumatic mitral (valve) insufficiency: Secondary | ICD-10-CM | POA: Diagnosis not present

## 2016-08-20 DIAGNOSIS — E138 Other specified diabetes mellitus with unspecified complications: Secondary | ICD-10-CM | POA: Diagnosis not present

## 2016-08-20 DIAGNOSIS — I519 Heart disease, unspecified: Secondary | ICD-10-CM

## 2016-12-17 ENCOUNTER — Encounter (HOSPITAL_COMMUNITY): Payer: Medicaid Other | Admitting: Internal Medicine

## 2016-12-29 ENCOUNTER — Telehealth (HOSPITAL_COMMUNITY): Payer: Self-pay | Admitting: Vascular Surgery

## 2016-12-29 ENCOUNTER — Inpatient Hospital Stay (HOSPITAL_COMMUNITY): Admission: RE | Admit: 2016-12-29 | Payer: Medicaid Other | Source: Ambulatory Visit | Admitting: Internal Medicine

## 2016-12-29 NOTE — Telephone Encounter (Signed)
PT called to resch appt he is having chest pain he is going to ED

## 2017-01-26 ENCOUNTER — Ambulatory Visit (HOSPITAL_COMMUNITY)
Admission: RE | Admit: 2017-01-26 | Discharge: 2017-01-26 | Disposition: A | Discharge status: Home / Self Care | Payer: Medicaid Other | Source: Ambulatory Visit | Attending: Internal Medicine | Admitting: Internal Medicine

## 2017-01-26 ENCOUNTER — Encounter (HOSPITAL_COMMUNITY): Payer: Self-pay | Admitting: Internal Medicine

## 2017-01-26 VITALS — BP 117/73 | HR 84 | Wt 183.0 lb

## 2017-01-26 DIAGNOSIS — I5023 Acute on chronic systolic (congestive) heart failure: Secondary | ICD-10-CM | POA: Diagnosis not present

## 2017-01-26 DIAGNOSIS — Z86711 Personal history of pulmonary embolism: Secondary | ICD-10-CM | POA: Diagnosis not present

## 2017-01-26 DIAGNOSIS — R079 Chest pain, unspecified: Secondary | ICD-10-CM | POA: Insufficient documentation

## 2017-01-26 DIAGNOSIS — Z7901 Long term (current) use of anticoagulants: Secondary | ICD-10-CM | POA: Insufficient documentation

## 2017-01-26 DIAGNOSIS — Z86718 Personal history of other venous thrombosis and embolism: Secondary | ICD-10-CM | POA: Insufficient documentation

## 2017-01-26 DIAGNOSIS — R0602 Shortness of breath: Secondary | ICD-10-CM | POA: Insufficient documentation

## 2017-01-26 DIAGNOSIS — I429 Cardiomyopathy, unspecified: Secondary | ICD-10-CM | POA: Insufficient documentation

## 2017-01-26 DIAGNOSIS — E785 Hyperlipidemia, unspecified: Secondary | ICD-10-CM | POA: Insufficient documentation

## 2017-01-26 DIAGNOSIS — T45515A Adverse effect of anticoagulants, initial encounter: Secondary | ICD-10-CM | POA: Insufficient documentation

## 2017-01-26 DIAGNOSIS — I2510 Atherosclerotic heart disease of native coronary artery without angina pectoris: Secondary | ICD-10-CM | POA: Insufficient documentation

## 2017-01-26 DIAGNOSIS — I110 Hypertensive heart disease with heart failure: Secondary | ICD-10-CM | POA: Insufficient documentation

## 2017-01-26 DIAGNOSIS — I428 Other cardiomyopathies: Secondary | ICD-10-CM

## 2017-01-26 DIAGNOSIS — E119 Type 2 diabetes mellitus without complications: Secondary | ICD-10-CM | POA: Insufficient documentation

## 2017-01-26 DIAGNOSIS — I5022 Chronic systolic (congestive) heart failure: Secondary | ICD-10-CM | POA: Insufficient documentation

## 2017-01-26 DIAGNOSIS — Z79899 Other long term (current) drug therapy: Secondary | ICD-10-CM | POA: Insufficient documentation

## 2017-01-26 DIAGNOSIS — Z7984 Long term (current) use of oral hypoglycemic drugs: Secondary | ICD-10-CM | POA: Insufficient documentation

## 2017-01-26 DIAGNOSIS — Z8249 Family history of ischemic heart disease and other diseases of the circulatory system: Secondary | ICD-10-CM | POA: Insufficient documentation

## 2017-01-26 DIAGNOSIS — Z8673 Personal history of transient ischemic attack (TIA), and cerebral infarction without residual deficits: Secondary | ICD-10-CM | POA: Insufficient documentation

## 2017-01-26 DIAGNOSIS — Z87891 Personal history of nicotine dependence: Secondary | ICD-10-CM | POA: Insufficient documentation

## 2017-01-26 DIAGNOSIS — I340 Nonrheumatic mitral (valve) insufficiency: Secondary | ICD-10-CM | POA: Insufficient documentation

## 2017-01-26 MED ORDER — FUROSEMIDE 40 MG PO TABS: ORAL_TABLET | ORAL | 3 refills | Status: DC

## 2017-01-26 MED ORDER — SACUBITRIL-VALSARTAN 24-26 MG PO TABS: 1.0000 | ORAL_TABLET | Freq: Two times a day (BID) | ORAL | 3 refills | Status: DC

## 2017-01-26 NOTE — Progress Notes (Signed)
ADVANCED HF CLINIC NOTE  Patient ID: Ian Foster, male   DOB: [birth year], 51 y.o.   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 DM, HTN, HLD, acute MCA stroke in the setting of INR 1.21  He has been followed at [facility] by Dr. Cory Roughen for his cardiology care. He was found to have EF 25-30% at time of his saddle PE diagnosis in 10/2015 with cardiac cath 10/11/15 showing patent coronaries with mild nonobstructive CAD (20% pLAD, 20% mLAD, 30% pCx, 20% pRCA, 20% mRCA, 20% dRCA, 30% OM2).   He presented to [facility] in January 2017 with symptoms of left facial droop and drooling from the left side of the mouth. He reported running out of Coumadin several days prior to admission - says he was only given 30 tablets but due to his dose being extra tablets on certain days, he ran out early. He say he has to get his meds through a crisis center so obtaining refills is difficult.Marland Kitchen MRI showed right MCA distrubution infarct. Carotid duplex 12/27/15: no significant (1-39%) ICA stenosis. 2D echo in our system 12/27/15 showed mod LVH, EF 10-15%, severe diffuse HK, RWMA cannot be excluded, mild-mod AI/MR, mod-severely dilated LA. Prior to d/c he was diuresed and there was concern for low output HF. D/C weight 212.  He presents today for heart failure follow up. He complains of early satiety, increased SOB and fatigue. He lives with his mother and does most of the housework, get SOB after doing laundry and cooking. He currently takes 40mg  Lasix BID, takes an extra 20mg  Lasix at least 2 times a week. He denies orthopnea, but does have frequent spells of PND. When he "overdoes it" he gets occasional chest pain, which is associated with SOB. Resolves with rest. He drinks a large amount of fluid, at least 3 cans of Pepsi  and 4 bottles of water. Denies peripheral edema. Misses doses of his medications occasionally.   ROS: All systems negative except as listed in HPI, PMH and Problem List.  SH:  Social History   Social History  . Marital status: Single    Spouse name: N/A  . Number of children: N/A  . Years of education: N/A   Occupational History  . Not on file.   Social History Main Topics  . Smoking status: Former Smoker    Types: Cigarettes  . Smokeless tobacco: Never Used     Comment: pt quit 6 years ago  . Alcohol use No  . Drug use: No  . Sexual activity: Not on file   Other Topics Concern  . Not on file   Social History Narrative  . No narrative on file    FH:  Family History  Problem Relation Age of Onset  . Hypertension Mother     Past Medical History:  Diagnosis Date  . Aortic insufficiency    a. 2D echo 12/27/15: mild-mod AI.  Marland Kitchen Chronic systolic CHF (congestive heart failure) (HCC)    a. EF 25% in 10/2015, 20-25% in 12/13/15 at [facility]. b. EF 10-15% by echo 12/27/15 at [facility].  . CVA (cerebral infarction)    a. 12/2015: R MCA stroke in setting of subtherapeutic INR/coumadin noncompliance.  . Diabetes mellitus without complication (HCC)   . DVT (deep venous thrombosis) (HCC)   .  Hyperlipidemia   . Hypertension   . Mild CAD    a. cardiac cath 10/11/15 showing patent coronaries with mild nonobstructive CAD (20% pLAD, 20% mLAD, 30% pCx, 20% pRCA, 20% mRCA, 20% dRCA, 30% OM2).  . Mitral regurgitation    a. 2D echo 12/27/15: mild-mod MR.  Marland Kitchen NICM (nonischemic cardiomyopathy) (HCC)   . Protein C deficiency (HCC)   . Protein S deficiency (HCC)   . Pulmonary embolism (HCC)    a. h/o saddle PE 10/2015.    Current Outpatient Prescriptions  Medication Sig Dispense Refill  . atorvastatin (LIPITOR) 80 MG tablet Take 1 tablet (80 mg total) by mouth daily. 30 tablet 3  . digoxin (LANOXIN) 0.125 MG tablet Take 1 tablet (0.125 mg total) by mouth daily. 30 tablet 3  . glipiZIDE (GLUCOTROL)  10 MG tablet Take 10 mg by mouth 2 (two) times daily.    Marland Kitchen lisinopril (PRINIVIL,ZESTRIL) 10 MG tablet Take 10 mg by mouth daily.    . metFORMIN (GLUCOPHAGE) 1000 MG tablet Take 1,000 mg by mouth 2 (two) times daily.    . metoprolol succinate (TOPROL-XL) 25 MG 24 hr tablet Take 1 tablet (25 mg total) by mouth daily. 30 tablet 3  . Potassium Chloride ER 20 MEQ TBCR Take 20 mEq by mouth daily. 30 tablet 3  . furosemide (LASIX) 20 MG tablet Take 2 tablets (40 mg total) by mouth 2 (two) times daily. (Patient not taking: Reported on 01/26/2017) 60 tablet 3  . warfarin (COUMADIN) 5 MG tablet Take 2-3 tablets (10-15 mg total) by mouth See admin instructions. 10 mg on all other days except on Wednesday patient to take 15 mg (Patient not taking: Reported on 01/26/2017) 60 tablet 0   No current facility-administered medications for this encounter.     Vitals:   01/26/17 1522  BP: 130/78  Pulse: 86  SpO2: 100%  Weight: 183 lb (83 kg)    PHYSICAL EXAM:  General:  Well appearing male in NAD.  HEENT: normal Neck: supple. JVP 8-9 cm. Carotids 2+ bilaterally; no bruits. No lymphadenopathy or thryomegaly appreciated. Cor: PMI laterally displaced. Regular rate & rhythm. No rubs, gallops. 2/6 systolic murmur at apex  Lungs: clear to auscultation Abdomen: soft, nontender, nondistended. No hepatosplenomegaly. No bruits or masses. Good bowel sounds. Extremities: no cyanosis, clubbing, rash, 1+ pretibial edema Neuro: alert & orientedx3, cranial nerves grossly intact. Moves all 4 extremities w/o difficulty. Affect pleasant.    ASSESSMENT & PLAN: 1. Chronic systolic HF - due to NICM. EF 10-15% by echo 1/17. Cath 2016 non-obstructive CAD [facility]) -NYHA II-III. Volume status up on exam and by REDS - REDS vest reading 45.   -Increase lasix to 80mg  in the am and 40mg  in the evenings.  -Change lisinopril to entresto 24/26mg  BID.  -Continue Toprol 25 daily and digoxin. 2. Multiple PE/DVTs -H/o protein C&S  deficiency  -Continue coumadin 3. Recent MCA stroke -Full recovery -Continue statin and coumadin  4. DM2 - per primary 5. CAD - non-obstructive per cath 2016. Off ASA due to warfarin. Continue statin.   Follow up in 4 weeks with Cicero Duck to titrate up Entresto, get BMP at that visit. Echo set up for 02/25/17.    Oswaldo Conroy Smith,MD 3:44 PM   Patient seen and examined with Suzzette Righter, NP. We discussed all aspects of the encounter. I agree with the assessment and plan as stated above.   He is volume overloaded on exam and by REDS vest. REDS may be slightly overestimated. NYHA  II. Will increase lasix and also switch to Entresto. See back in 2 weeks. Continue Toprol and digoxin. Has repeat echo next month. Will need to consider ICD. Continue warfarin for hypercoaguable state.   Bensimhon, Daniel,MD 10:42 AM

## 2017-01-26 NOTE — Patient Instructions (Signed)
Stop Lisinopril  Start Entresto 24/26 mg Twice daily, STARTING WED 2/28 AM  Start Furosemide 80 mg (2 tabs) in AM and 40 mg (1 tab) in PM  Your physician has requested that you have an echocardiogram. Echocardiography is a painless test that uses sound waves to create images of your heart. It provides your doctor with information about the size and shape of your heart and how well your heart's chambers and valves are working. This procedure takes approximately one hour. There are no restrictions for this procedure.  IN 1 MONTH  Your physician recommends that you schedule a follow-up appointment in: 1 month with Fara Chute D  Your physician recommends that you schedule a follow-up appointment in: 3 months with Dr Gala Romney

## 2017-01-26 NOTE — Progress Notes (Signed)
    ReDS Vest - 01/26/17 1600      ReDS Vest   MR  No   Estimated volume prior to reading High   Fitting Posture Standing   Height Marker Tall   Ruler Value 45   Center Strip Aligned   ReDS Value 11.5   Anatomical Comments n/a

## 2017-01-27 ENCOUNTER — Encounter (HOSPITAL_COMMUNITY): Payer: Self-pay | Admitting: Pharmacist

## 2017-01-27 ENCOUNTER — Other Ambulatory Visit (HOSPITAL_COMMUNITY): Payer: Self-pay | Admitting: *Deleted

## 2017-01-27 MED ORDER — SACUBITRIL-VALSARTAN 24-26 MG PO TABS: 1.0000 | ORAL_TABLET | Freq: Two times a day (BID) | ORAL | 11 refills | Status: AC

## 2017-01-30 ENCOUNTER — Telehealth (HOSPITAL_COMMUNITY): Payer: Self-pay | Admitting: Pharmacist

## 2017-01-30 NOTE — Telephone Encounter (Signed)
[employer] patient assistance approved for [employer] 24-26 mg BID through 01/28/18.   Tyler Deis. Bonnye Fava, PharmD, BCPS, CPP Clinical Pharmacist Pager: [phone] Phone: [phone] 01/30/2017 3:40 PM

## 2017-02-25 ENCOUNTER — Inpatient Hospital Stay (HOSPITAL_COMMUNITY): Admission: RE | Admit: 2017-02-25 | Payer: Medicaid Other | Source: Ambulatory Visit

## 2017-02-25 ENCOUNTER — Ambulatory Visit (HOSPITAL_COMMUNITY): Admission: RE | Admit: 2017-02-25 | Payer: Medicaid Other | Source: Ambulatory Visit

## 2017-04-29 ENCOUNTER — Inpatient Hospital Stay (HOSPITAL_COMMUNITY): Admission: RE | Admit: 2017-04-29 | Payer: Medicaid Other | Source: Ambulatory Visit | Admitting: Internal Medicine

## 2017-09-11 ENCOUNTER — Other Ambulatory Visit (HOSPITAL_COMMUNITY): Payer: Self-pay | Admitting: Internal Medicine

## 2017-09-14 ENCOUNTER — Other Ambulatory Visit (HOSPITAL_COMMUNITY): Payer: Self-pay | Admitting: Cardiology

## 2017-09-28 ENCOUNTER — Other Ambulatory Visit (HOSPITAL_COMMUNITY): Payer: Self-pay | Admitting: *Deleted

## 2017-10-02 ENCOUNTER — Other Ambulatory Visit (HOSPITAL_COMMUNITY): Payer: Self-pay | Admitting: *Deleted

## 2017-11-07 IMAGING — CT CT HEAD W/O CM
2 series · 16 of 30 positions shown, 20 images · non-contrast
Comparison: None.

CLINICAL DATA: Left arm weakness

EXAM:
CT HEAD WITHOUT CONTRAST
TECHNIQUE: Contiguous axial images were obtained from the base of the skull
through the vertex without intravenous contrast.

[Series 201: head w/o, idose (1) · axial · non-contrast · 0.46mm/px · z∈[+258,+388]mm · 13 of 32 slices shown, 17 images]
[im 3/32  brain]
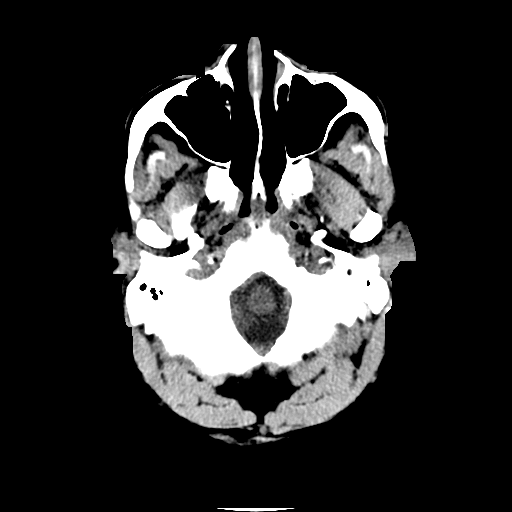
[im 3/32  bone]
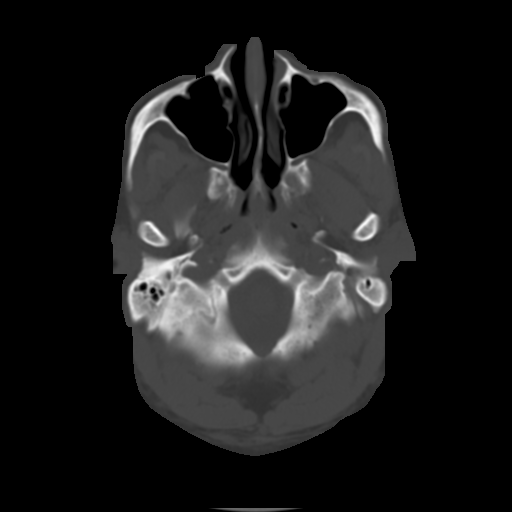
[im 5/32  brain]
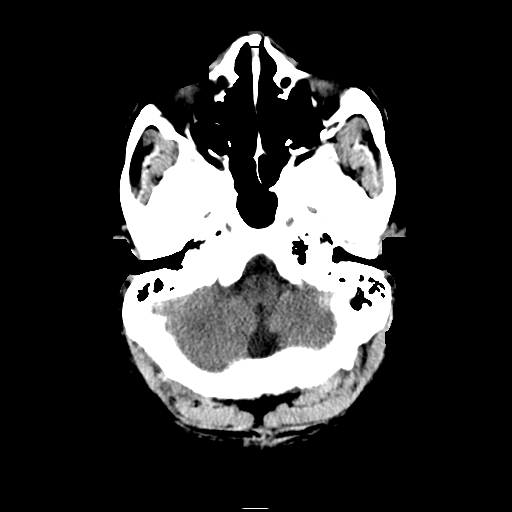
[im 7/32  brain]
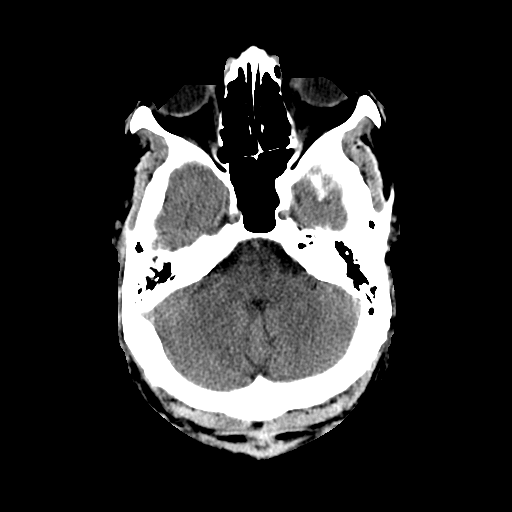
[im 9/32  brain]
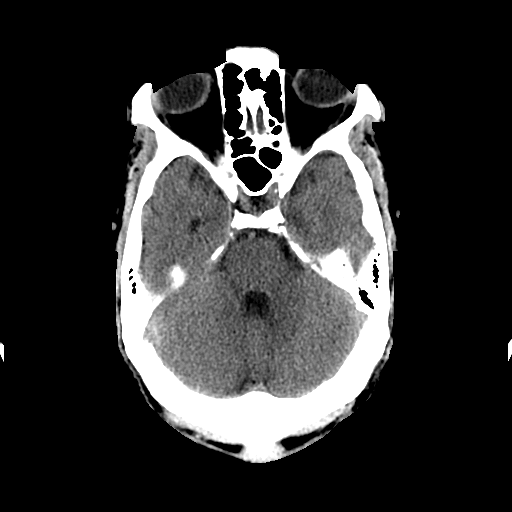
[im 12/32  brain]
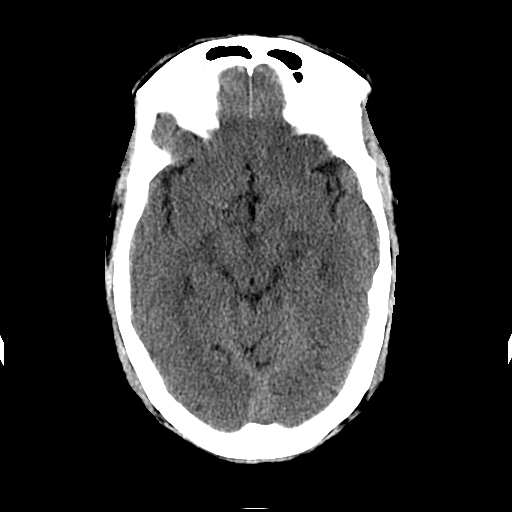
[im 12/32  bone]
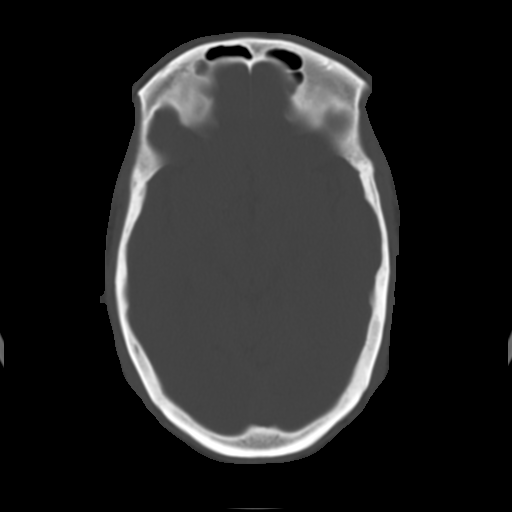
[im 14/32  brain]
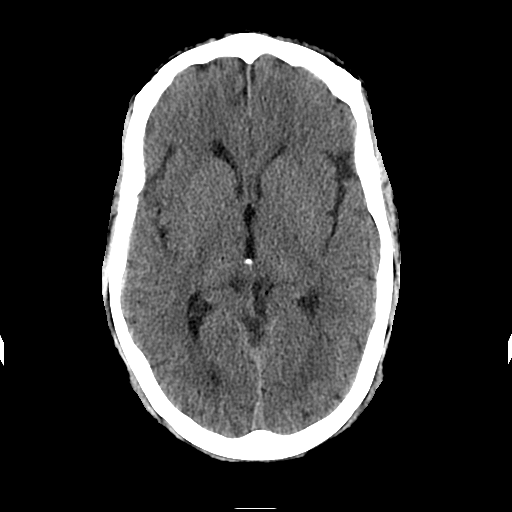
[im 16/32  brain]
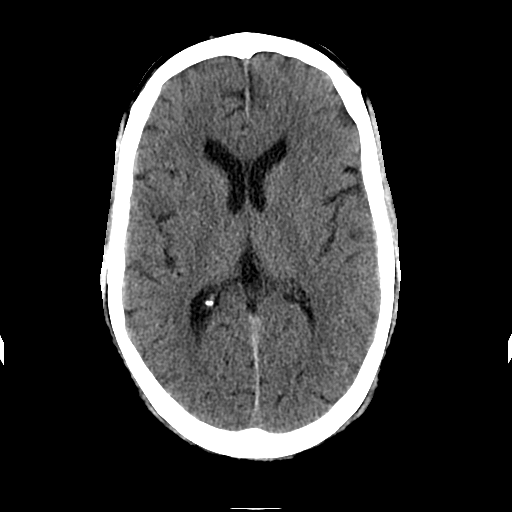
[im 18/32  brain]
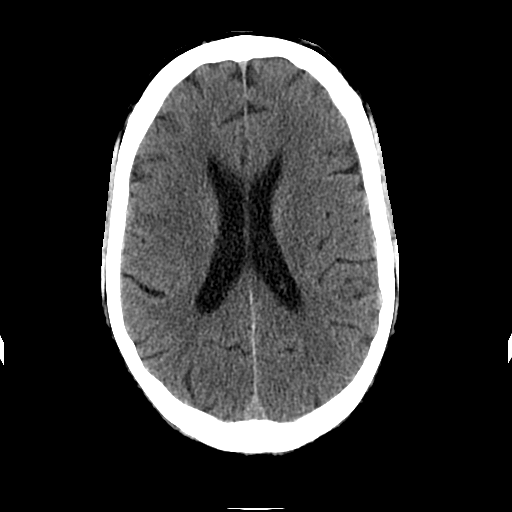
[im 20/32  brain]
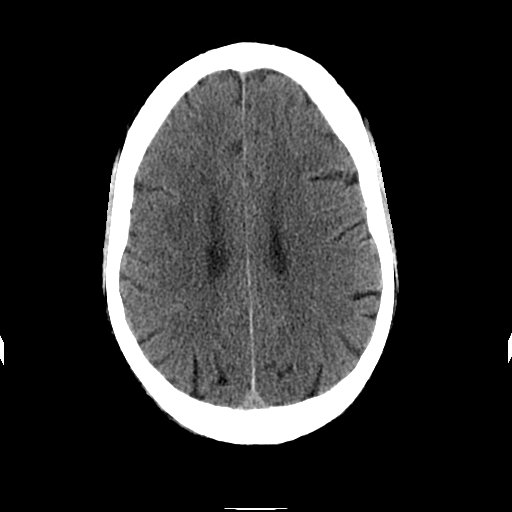
[im 20/32  bone]
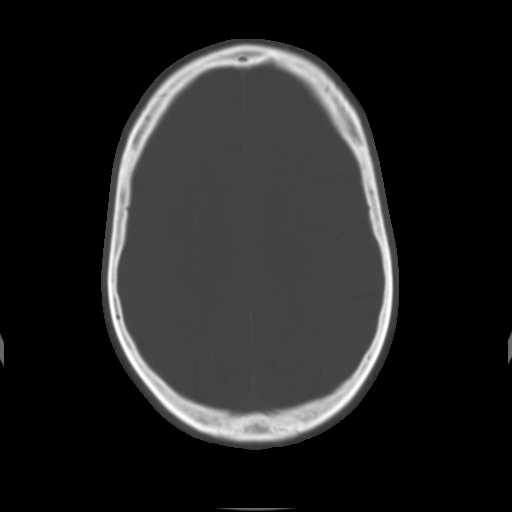
[im 23/32  brain]
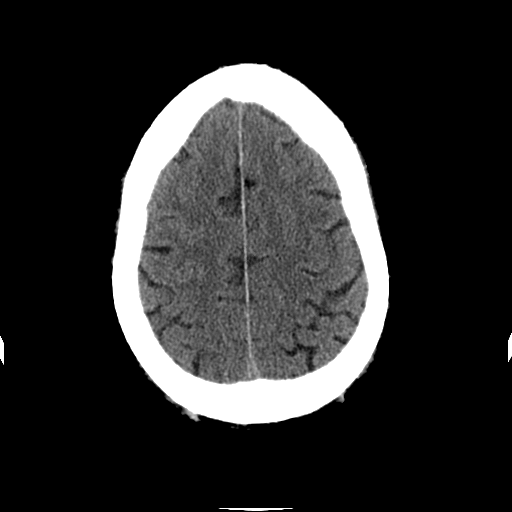
[im 25/32  brain]
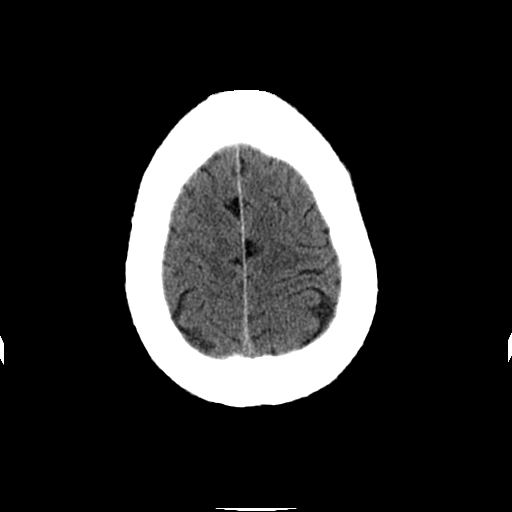
[im 27/32  brain]
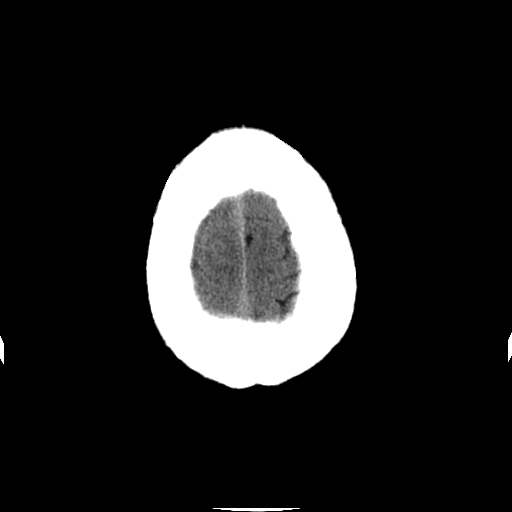
[im 29/32  brain]
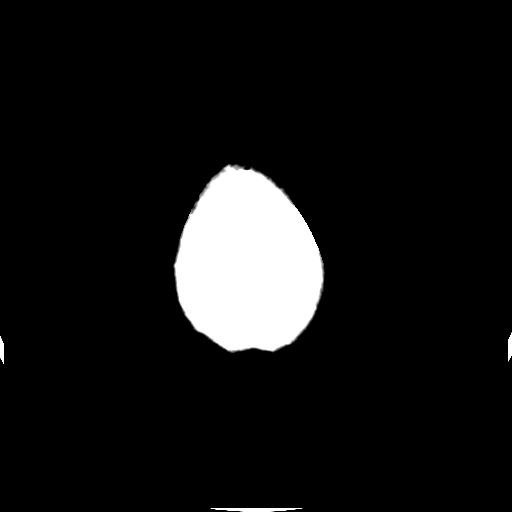
[im 29/32  bone]
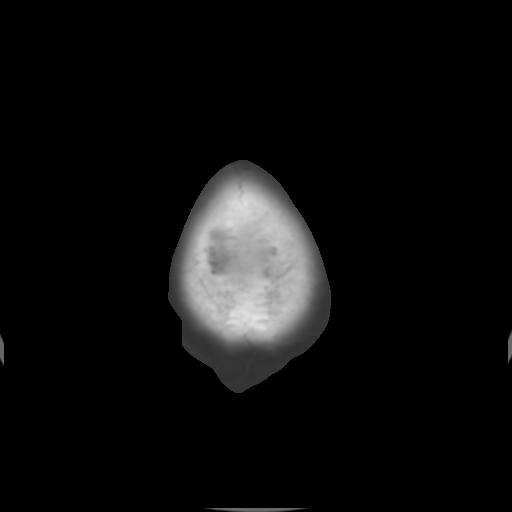

[Series 202: head w/o bone, idose (1) · axial · non-contrast · 0.46mm/px · z∈[+258,+303]mm · 3 of 32 slices shown]
[im 3/32  bone]
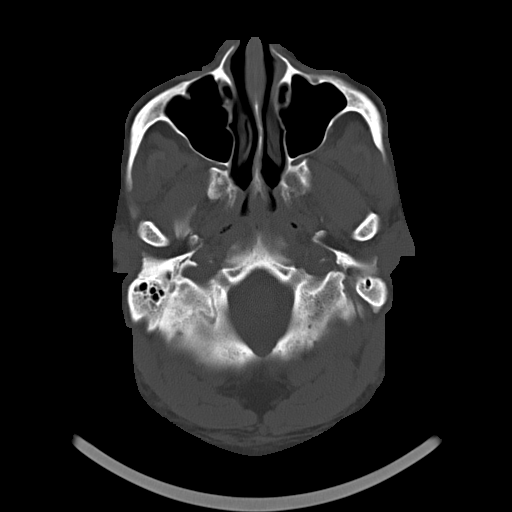
[im 7/32  bone]
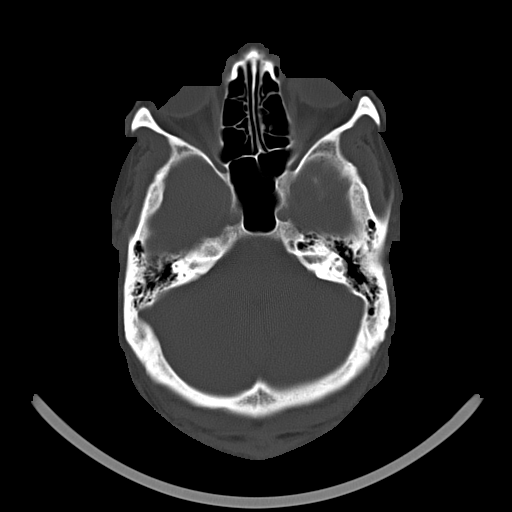
[im 12/32  bone]
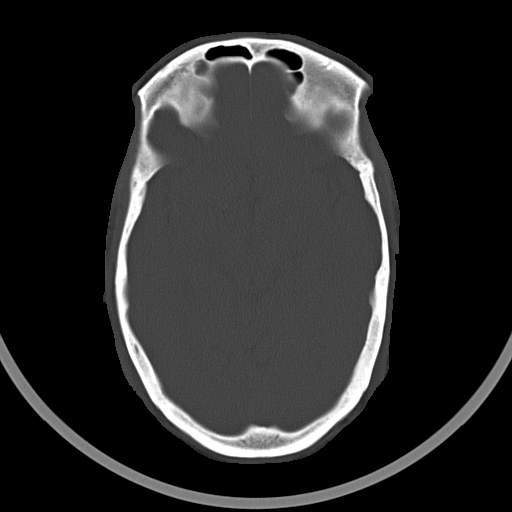

[16 of 30 positions shown; findings below may reference images not displayed]

FINDINGS: The bony calvarium is intact. The ventricles are of normal size and
configuration. No findings to suggest acute hemorrhage, acute
infarction or space-occupying mass lesion are noted.
IMPRESSION: No acute abnormality noted.

## 2017-11-07 IMAGING — DX DG CHEST 2V
2 series · 2 of 2 positions shown · non-contrast
Comparison: None.

CLINICAL DATA: Shortness of breath. Bilateral flank pain.
Ex-smoker.

EXAM:
CHEST  2 VIEW

[chest pa]
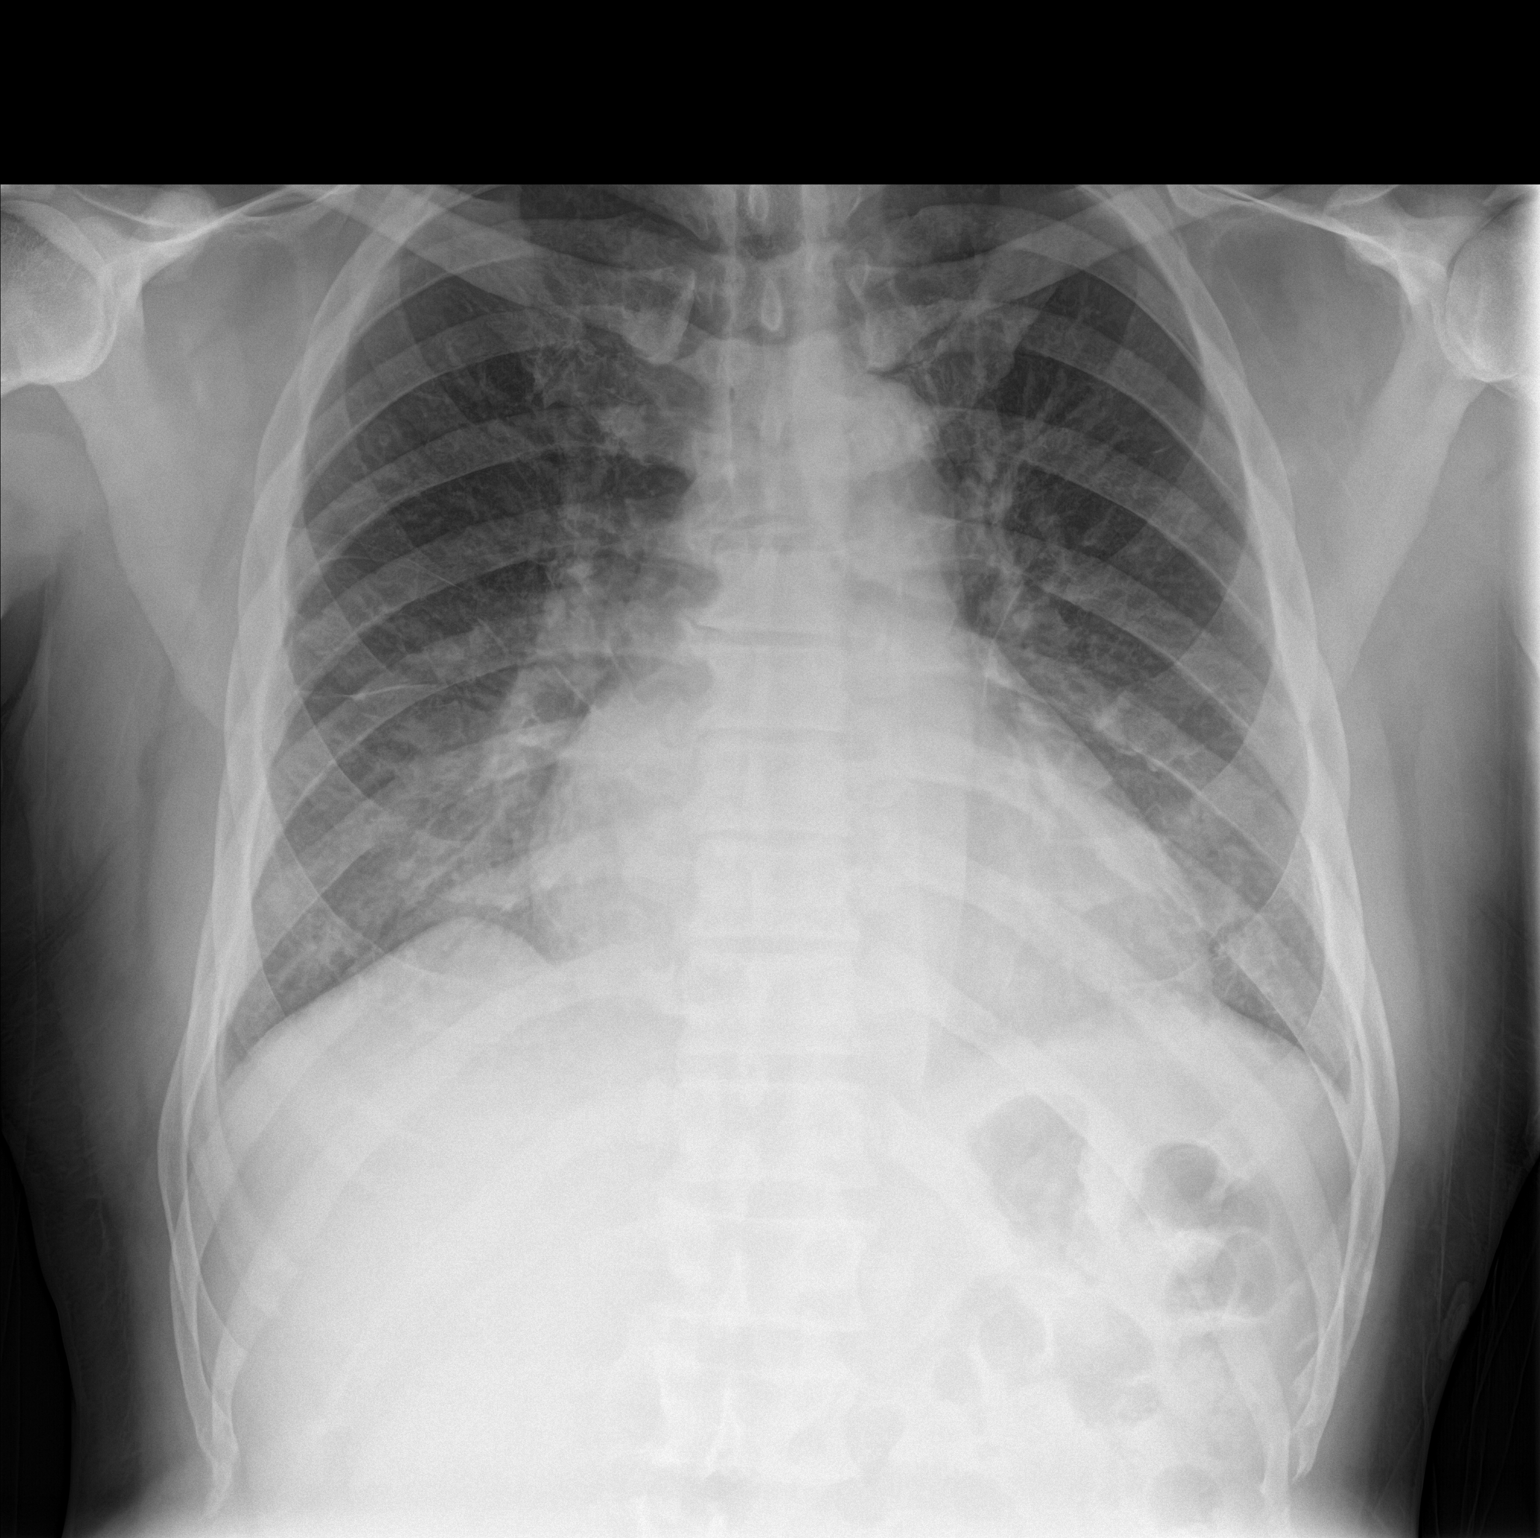

[chest lat]
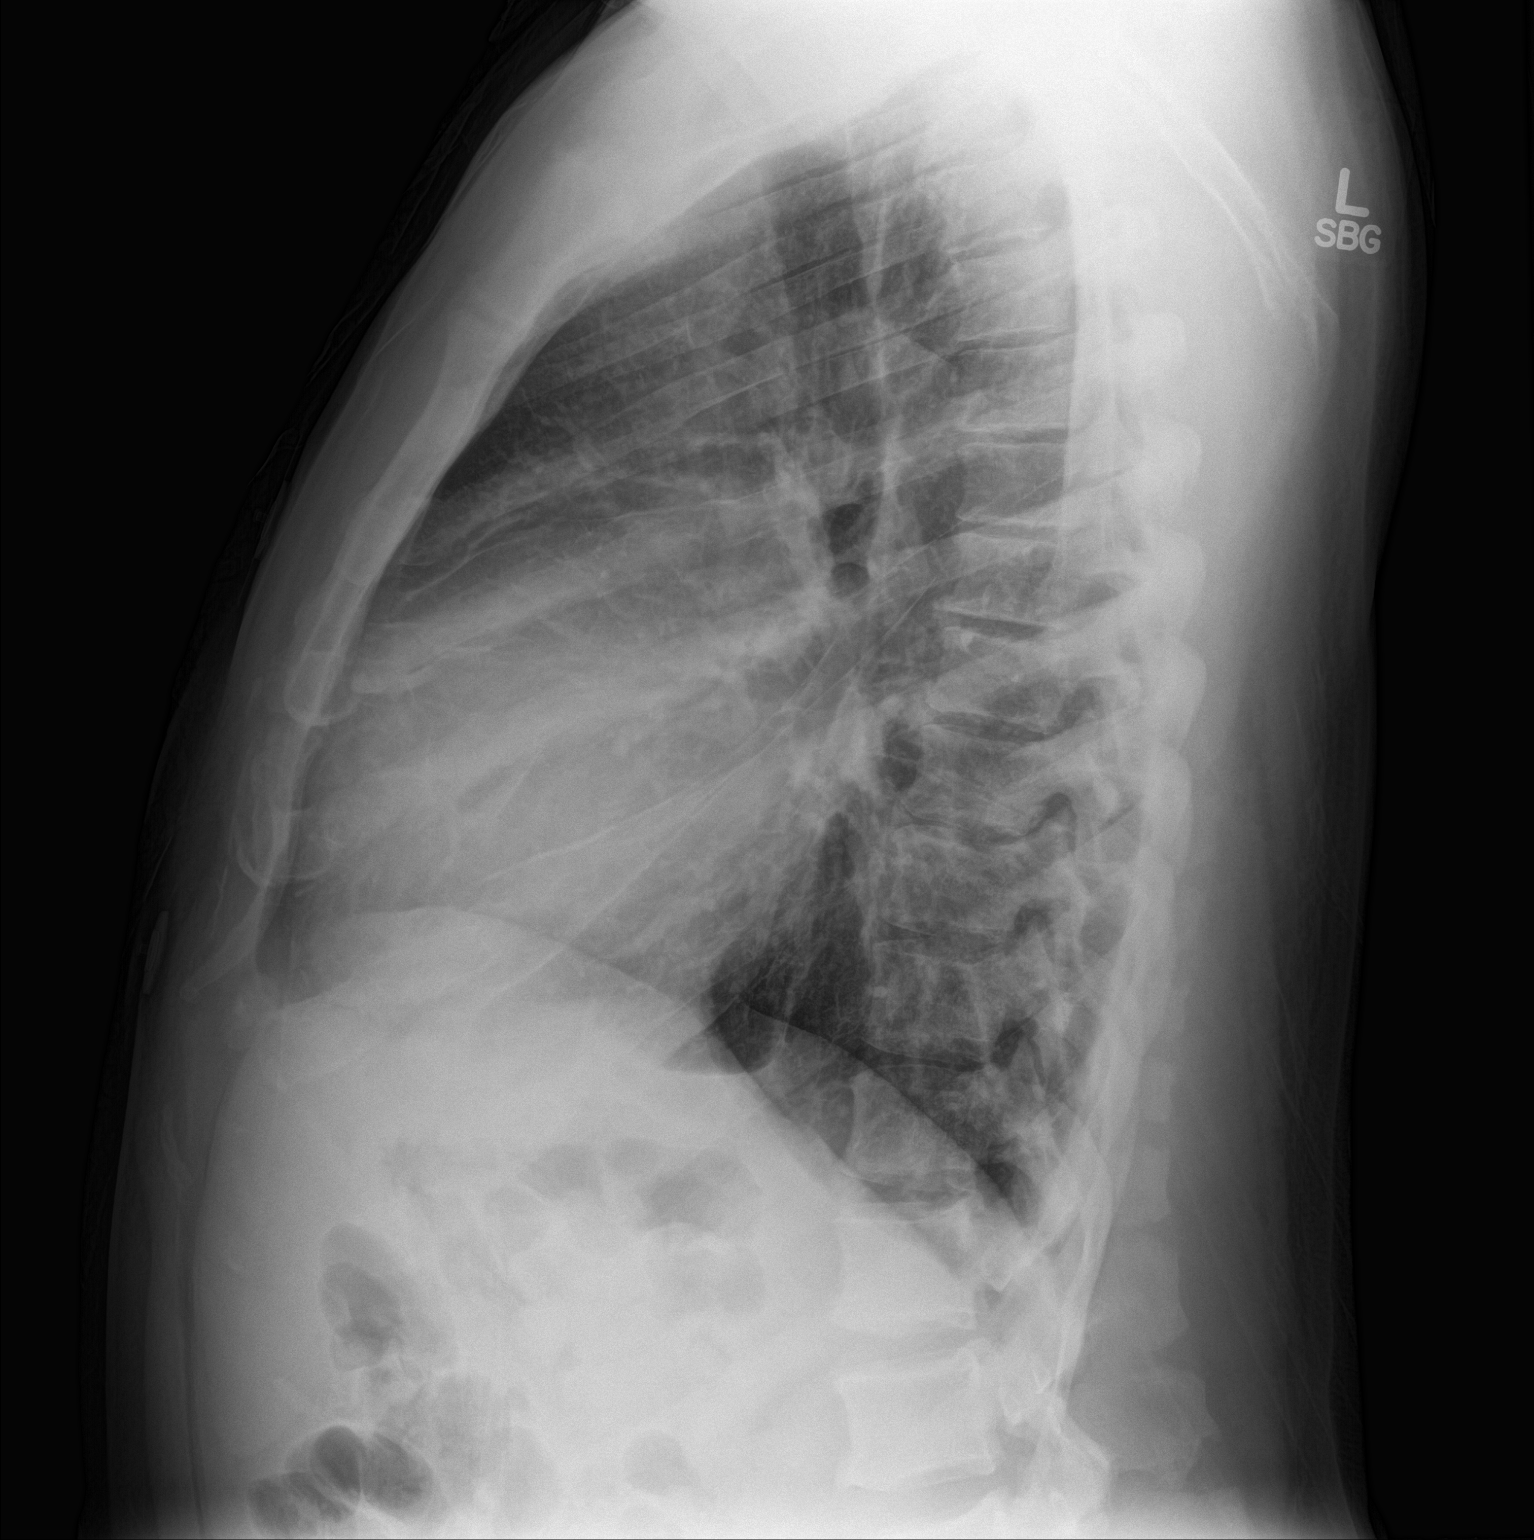

[2 of 2 positions shown; findings below may reference images not displayed]

FINDINGS: Enlarged cardiac silhouette. Mild prominence of the pulmonary
vasculature and interstitial markings with more prominent
interstitial markings in the lower lung zones. Minimal bilateral
pleural fluid. Unremarkable bones.
IMPRESSION: Cardiomegaly and mild changes of congestive heart failure.

## 2017-11-16 ENCOUNTER — Other Ambulatory Visit (HOSPITAL_COMMUNITY): Payer: Self-pay | Admitting: Cardiology

## 2017-12-10 ENCOUNTER — Other Ambulatory Visit (HOSPITAL_COMMUNITY): Payer: Self-pay | Admitting: Internal Medicine
